# Patient Record
Sex: Male | Born: 1935 | Race: White | Hispanic: No
Health system: Southern US, Community
[De-identification: ages and names within clinical notes are randomized; demographics above are authoritative.]

---

## 2009-10-21 ENCOUNTER — Emergency Department: Payer: Self-pay

## 2009-11-11 ENCOUNTER — Ambulatory Visit: Payer: Self-pay | Admitting: Urology

## 2009-11-29 ENCOUNTER — Ambulatory Visit: Payer: Self-pay | Admitting: Urology

## 2010-01-12 ENCOUNTER — Emergency Department: Payer: Self-pay | Admitting: Internal Medicine

## 2010-02-04 ENCOUNTER — Emergency Department: Payer: Self-pay | Admitting: Emergency Medicine

## 2010-02-22 ENCOUNTER — Ambulatory Visit: Payer: Self-pay | Admitting: Urology

## 2011-10-19 ENCOUNTER — Emergency Department: Payer: Self-pay | Admitting: Emergency Medicine

## 2011-12-17 ENCOUNTER — Ambulatory Visit: Payer: Self-pay | Admitting: Urology

## 2011-12-26 ENCOUNTER — Ambulatory Visit: Payer: Self-pay | Admitting: Urology

## 2012-01-10 ENCOUNTER — Ambulatory Visit: Payer: Self-pay | Admitting: Oncology

## 2012-01-15 ENCOUNTER — Ambulatory Visit: Payer: Self-pay | Admitting: Oncology

## 2012-02-01 ENCOUNTER — Ambulatory Visit: Payer: Self-pay | Admitting: Oncology

## 2012-03-03 ENCOUNTER — Ambulatory Visit: Payer: Self-pay | Admitting: Oncology

## 2012-03-07 LAB — CBC CANCER CENTER
Eosinophil #: 0.2 x10 3/mm (ref 0.0–0.7)
HGB: 11.7 g/dL — ABNORMAL LOW (ref 13.0–18.0)
Lymphocyte #: 2 x10 3/mm (ref 1.0–3.6)
MCH: 30.4 pg (ref 26.0–34.0)
MCHC: 33.6 g/dL (ref 32.0–36.0)
MCV: 91 fL (ref 80–100)
Monocyte #: 0.4 x10 3/mm (ref 0.0–0.7)
RDW: 15.7 % — ABNORMAL HIGH (ref 11.5–14.5)

## 2012-03-07 LAB — COMPREHENSIVE METABOLIC PANEL
Albumin: 3.8 g/dL (ref 3.4–5.0)
Anion Gap: 10 (ref 7–16)
BUN: 22 mg/dL — ABNORMAL HIGH (ref 7–18)
Co2: 28 mmol/L (ref 21–32)
Creatinine: 1.03 mg/dL (ref 0.60–1.30)
EGFR (African American): >60
EGFR (Non-African Amer.): >60
Glucose: 129 mg/dL — ABNORMAL HIGH (ref 65–99)
Osmolality: 283 (ref 275–301)
SGPT (ALT): 15 U/L
Sodium: 139 mmol/L (ref 136–145)

## 2012-03-21 LAB — CBC CANCER CENTER
Basophil #: 0 x10 3/mm (ref 0.0–0.1)
Basophil %: 0.4 %
HCT: 35.7 % — ABNORMAL LOW (ref 40.0–52.0)
HGB: 11.5 g/dL — ABNORMAL LOW (ref 13.0–18.0)
Lymphocyte #: 2 x10 3/mm (ref 1.0–3.6)
Lymphocyte %: 21.5 %
Monocyte #: 0.5 x10 3/mm (ref 0.2–1.0)
Neutrophil #: 6.5 x10 3/mm (ref 1.4–6.5)
Platelet: 244 x10 3/mm (ref 150–440)
RBC: 3.9 10*6/uL — ABNORMAL LOW (ref 4.40–5.90)

## 2012-03-21 LAB — COMPREHENSIVE METABOLIC PANEL
Albumin: 3.9 g/dL (ref 3.4–5.0)
Anion Gap: 11 (ref 7–16)
BUN: 30 mg/dL — ABNORMAL HIGH (ref 7–18)
Bilirubin,Total: 0.2 mg/dL (ref 0.2–1.0)
Calcium, Total: 9.5 mg/dL (ref 8.5–10.1)
Chloride: 100 mmol/L (ref 98–107)
Co2: 29 mmol/L (ref 21–32)
Creatinine: 0.86 mg/dL (ref 0.60–1.30)
Glucose: 117 mg/dL — ABNORMAL HIGH (ref 65–99)
Osmolality: 287 (ref 275–301)
Potassium: 3 mmol/L — ABNORMAL LOW (ref 3.5–5.1)
SGOT(AST): 12 U/L — ABNORMAL LOW (ref 15–37)
SGPT (ALT): 14 U/L
Sodium: 140 mmol/L (ref 136–145)

## 2012-03-23 LAB — PSA: PSA: 135.9 ng/mL — ABNORMAL HIGH (ref 0.0–4.0)

## 2012-04-02 ENCOUNTER — Ambulatory Visit: Payer: Self-pay | Admitting: Oncology

## 2012-04-04 LAB — CBC CANCER CENTER
Eosinophil #: 0.1 x10 3/mm (ref 0.0–0.7)
HCT: 37.2 % — ABNORMAL LOW (ref 40.0–52.0)
HGB: 12 g/dL — ABNORMAL LOW (ref 13.0–18.0)
Lymphocyte %: 18.7 %
MCH: 29.5 pg (ref 26.0–34.0)
MCHC: 32.1 g/dL (ref 32.0–36.0)
Monocyte %: 5.2 %
RBC: 4.06 10*6/uL — ABNORMAL LOW (ref 4.40–5.90)
RDW: 15.8 % — ABNORMAL HIGH (ref 11.5–14.5)
WBC: 11.1 x10 3/mm — ABNORMAL HIGH (ref 3.8–10.6)

## 2012-04-04 LAB — COMPREHENSIVE METABOLIC PANEL
Albumin: 3.9 g/dL (ref 3.4–5.0)
Alkaline Phosphatase: 97 U/L (ref 50–136)
Calcium, Total: 8.9 mg/dL (ref 8.5–10.1)
Co2: 30 mmol/L (ref 21–32)
Creatinine: 1.05 mg/dL (ref 0.60–1.30)
EGFR (African American): >60
EGFR (Non-African Amer.): >60
Potassium: 2.9 mmol/L — ABNORMAL LOW (ref 3.5–5.1)
SGPT (ALT): 16 U/L
Sodium: 140 mmol/L (ref 136–145)
Total Protein: 7.2 g/dL (ref 6.4–8.2)

## 2012-04-05 LAB — PSA: PSA: 95.3 ng/mL — ABNORMAL HIGH (ref 0.0–4.0)

## 2012-05-03 ENCOUNTER — Ambulatory Visit: Payer: Self-pay | Admitting: Oncology

## 2012-05-09 LAB — COMPREHENSIVE METABOLIC PANEL
Albumin: 4 g/dL (ref 3.4–5.0)
Alkaline Phosphatase: 101 U/L (ref 50–136)
Anion Gap: 10 (ref 7–16)
BUN: 32 mg/dL — ABNORMAL HIGH (ref 7–18)
Bilirubin,Total: 0.3 mg/dL (ref 0.2–1.0)
Calcium, Total: 9.2 mg/dL (ref 8.5–10.1)
Chloride: 102 mmol/L (ref 98–107)
Co2: 29 mmol/L (ref 21–32)
Creatinine: 0.95 mg/dL (ref 0.60–1.30)
EGFR (African American): >60
Potassium: 3.3 mmol/L — ABNORMAL LOW (ref 3.5–5.1)
Total Protein: 7.3 g/dL (ref 6.4–8.2)

## 2012-06-02 ENCOUNTER — Ambulatory Visit: Payer: Self-pay | Admitting: Oncology

## 2012-06-06 LAB — CBC CANCER CENTER
Basophil #: 0.1 x10 3/mm (ref 0.0–0.1)
Basophil %: 0.7 %
Eosinophil %: 0.9 %
HCT: 40.1 % (ref 40.0–52.0)
HGB: 13.2 g/dL (ref 13.0–18.0)
Lymphocyte #: 1.7 x10 3/mm (ref 1.0–3.6)
MCH: 30.6 pg (ref 26.0–34.0)
Monocyte #: 0.5 x10 3/mm (ref 0.2–1.0)
Platelet: 205 x10 3/mm (ref 150–440)
RDW: 17.3 % — ABNORMAL HIGH (ref 11.5–14.5)
WBC: 10.9 x10 3/mm — ABNORMAL HIGH (ref 3.8–10.6)

## 2012-06-06 LAB — COMPREHENSIVE METABOLIC PANEL
Albumin: 3.9 g/dL (ref 3.4–5.0)
Alkaline Phosphatase: 101 U/L (ref 50–136)
Bilirubin,Total: 0.3 mg/dL (ref 0.2–1.0)
Calcium, Total: 9.2 mg/dL (ref 8.5–10.1)
Co2: 29 mmol/L (ref 21–32)
Creatinine: 1.12 mg/dL (ref 0.60–1.30)
EGFR (Non-African Amer.): >60
Glucose: 118 mg/dL — ABNORMAL HIGH (ref 65–99)
Osmolality: 283 (ref 275–301)
Potassium: 2.9 mmol/L — ABNORMAL LOW (ref 3.5–5.1)
Sodium: 137 mmol/L (ref 136–145)

## 2012-07-03 ENCOUNTER — Ambulatory Visit: Payer: Self-pay | Admitting: Oncology

## 2012-08-14 ENCOUNTER — Ambulatory Visit: Payer: Self-pay | Admitting: Vascular Surgery

## 2012-08-14 LAB — CBC
HGB: 12.8 g/dL — ABNORMAL LOW (ref 13.0–18.0)
MCH: 32.4 pg (ref 26.0–34.0)
MCV: 99 fL (ref 80–100)
Platelet: 231 10*3/uL (ref 150–440)
RBC: 3.94 10*6/uL — ABNORMAL LOW (ref 4.40–5.90)
RDW: 15 % — ABNORMAL HIGH (ref 11.5–14.5)

## 2012-08-14 LAB — BASIC METABOLIC PANEL
Anion Gap: 7 (ref 7–16)
Calcium, Total: 9.1 mg/dL (ref 8.5–10.1)
Chloride: 109 mmol/L — ABNORMAL HIGH (ref 98–107)
Co2: 25 mmol/L (ref 21–32)
Creatinine: 0.76 mg/dL (ref 0.60–1.30)
EGFR (African American): >60
Glucose: 93 mg/dL (ref 65–99)
Osmolality: 287 (ref 275–301)
Sodium: 141 mmol/L (ref 136–145)

## 2012-08-22 ENCOUNTER — Inpatient Hospital Stay: Payer: Self-pay | Admitting: Vascular Surgery

## 2012-08-23 LAB — COMPREHENSIVE METABOLIC PANEL
Alkaline Phosphatase: 76 U/L (ref 50–136)
BUN: 14 mg/dL (ref 7–18)
Bilirubin,Total: 0.4 mg/dL (ref 0.2–1.0)
Co2: 23 mmol/L (ref 21–32)
EGFR (Non-African Amer.): >60
Osmolality: 284 (ref 275–301)
Potassium: 3.9 mmol/L (ref 3.5–5.1)
SGPT (ALT): 11 U/L — ABNORMAL LOW (ref 12–78)
Sodium: 142 mmol/L (ref 136–145)
Total Protein: 5.8 g/dL — ABNORMAL LOW (ref 6.4–8.2)

## 2012-08-23 LAB — CBC WITH DIFFERENTIAL/PLATELET
Basophil #: 0 10*3/uL (ref 0.0–0.1)
Eosinophil #: 0 10*3/uL (ref 0.0–0.7)
Eosinophil %: 0 %
HCT: 34.4 % — ABNORMAL LOW (ref 40.0–52.0)
Lymphocyte #: 1.2 10*3/uL (ref 1.0–3.6)
MCH: 32.8 pg (ref 26.0–34.0)
MCV: 97 fL (ref 80–100)
Monocyte #: 0.7 x10 3/mm (ref 0.2–1.0)
Monocyte %: 4.7 %
Neutrophil #: 13.1 10*3/uL — ABNORMAL HIGH (ref 1.4–6.5)
RDW: 14.6 % — ABNORMAL HIGH (ref 11.5–14.5)
WBC: 15 10*3/uL — ABNORMAL HIGH (ref 3.8–10.6)

## 2012-08-23 LAB — PROTIME-INR: INR: 1

## 2012-08-23 LAB — PHOSPHORUS: Phosphorus: 3.7 mg/dL (ref 2.5–4.9)

## 2012-09-09 ENCOUNTER — Ambulatory Visit: Payer: Self-pay | Admitting: Oncology

## 2012-09-11 ENCOUNTER — Ambulatory Visit: Payer: Self-pay | Admitting: Oncology

## 2012-09-11 LAB — CBC CANCER CENTER
Basophil %: 0.8 %
Eosinophil #: 0.2 x10 3/mm (ref 0.0–0.7)
Eosinophil %: 2 %
HCT: 38.2 % — ABNORMAL LOW (ref 40.0–52.0)
HGB: 12.3 g/dL — ABNORMAL LOW (ref 13.0–18.0)
Lymphocyte #: 2.7 x10 3/mm (ref 1.0–3.6)
MCH: 32.2 pg (ref 26.0–34.0)
MCHC: 32.3 g/dL (ref 32.0–36.0)
MCV: 100 fL (ref 80–100)
Monocyte #: 0.5 x10 3/mm (ref 0.2–1.0)
Platelet: 258 x10 3/mm (ref 150–440)
RBC: 3.83 10*6/uL — ABNORMAL LOW (ref 4.40–5.90)

## 2012-09-11 LAB — COMPREHENSIVE METABOLIC PANEL
Anion Gap: 11 (ref 7–16)
BUN: 27 mg/dL — ABNORMAL HIGH (ref 7–18)
Bilirubin,Total: 0.3 mg/dL (ref 0.2–1.0)
Calcium, Total: 9 mg/dL (ref 8.5–10.1)
Chloride: 105 mmol/L (ref 98–107)
Co2: 25 mmol/L (ref 21–32)
EGFR (African American): >60
EGFR (Non-African Amer.): >60
Glucose: 97 mg/dL (ref 65–99)
Osmolality: 286 (ref 275–301)
Potassium: 4.1 mmol/L (ref 3.5–5.1)
SGOT(AST): 15 U/L (ref 15–37)
SGPT (ALT): 19 U/L (ref 12–78)
Sodium: 141 mmol/L (ref 136–145)

## 2012-09-12 LAB — PSA: PSA: 38.2 ng/mL — ABNORMAL HIGH (ref 0.0–4.0)

## 2012-10-03 ENCOUNTER — Ambulatory Visit: Payer: Self-pay | Admitting: Oncology

## 2012-11-11 ENCOUNTER — Ambulatory Visit: Payer: Self-pay | Admitting: Emergency Medicine

## 2012-12-12 ENCOUNTER — Ambulatory Visit: Payer: Self-pay | Admitting: Oncology

## 2012-12-12 LAB — BASIC METABOLIC PANEL
BUN: 24 mg/dL — ABNORMAL HIGH (ref 7–18)
Chloride: 107 mmol/L (ref 98–107)
Co2: 26 mmol/L (ref 21–32)
Creatinine: 0.96 mg/dL (ref 0.60–1.30)
EGFR (African American): >60
EGFR (Non-African Amer.): >60
Glucose: 101 mg/dL — ABNORMAL HIGH (ref 65–99)
Osmolality: 291 (ref 275–301)
Potassium: 3.5 mmol/L (ref 3.5–5.1)
Sodium: 144 mmol/L (ref 136–145)

## 2012-12-15 LAB — PSA: PSA: 21.9 ng/mL — ABNORMAL HIGH (ref 0.0–4.0)

## 2013-01-03 ENCOUNTER — Ambulatory Visit: Payer: Self-pay | Admitting: Oncology

## 2013-03-24 ENCOUNTER — Ambulatory Visit: Payer: Self-pay | Admitting: Oncology

## 2013-03-24 LAB — CBC CANCER CENTER
Basophil #: 0.1 x10 3/mm (ref 0.0–0.1)
Eosinophil #: 0.2 x10 3/mm (ref 0.0–0.7)
Eosinophil %: 1.6 %
HCT: 39.1 % — ABNORMAL LOW (ref 40.0–52.0)
Lymphocyte #: 3.4 x10 3/mm (ref 1.0–3.6)
Lymphocyte %: 28.3 %
MCHC: 33.6 g/dL (ref 32.0–36.0)
Monocyte %: 5.5 %
Neutrophil #: 7.7 x10 3/mm — ABNORMAL HIGH (ref 1.4–6.5)
Platelet: 186 x10 3/mm (ref 150–440)
RBC: 4.07 10*6/uL — ABNORMAL LOW (ref 4.40–5.90)
RDW: 15.4 % — ABNORMAL HIGH (ref 11.5–14.5)

## 2013-03-24 LAB — COMPREHENSIVE METABOLIC PANEL
Anion Gap: 13 (ref 7–16)
Creatinine: 0.9 mg/dL (ref 0.60–1.30)
EGFR (African American): >60
Osmolality: 288 (ref 275–301)
SGOT(AST): 9 U/L — ABNORMAL LOW (ref 15–37)
SGPT (ALT): 14 U/L (ref 12–78)
Sodium: 142 mmol/L (ref 136–145)
Total Protein: 7 g/dL (ref 6.4–8.2)

## 2013-04-02 ENCOUNTER — Ambulatory Visit: Payer: Self-pay | Admitting: Oncology

## 2013-07-07 ENCOUNTER — Ambulatory Visit: Payer: Self-pay | Admitting: Ophthalmology

## 2013-07-13 ENCOUNTER — Ambulatory Visit: Payer: Self-pay | Admitting: Ophthalmology

## 2013-07-23 ENCOUNTER — Ambulatory Visit: Payer: Self-pay | Admitting: Oncology

## 2013-07-31 LAB — COMPREHENSIVE METABOLIC PANEL
Albumin: 3.7 g/dL (ref 3.4–5.0)
Alkaline Phosphatase: 86 U/L (ref 50–136)
Anion Gap: 11 (ref 7–16)
BUN: 35 mg/dL — ABNORMAL HIGH (ref 7–18)
Calcium, Total: 9.1 mg/dL (ref 8.5–10.1)
Co2: 27 mmol/L (ref 21–32)
Creatinine: 1.03 mg/dL (ref 0.60–1.30)
Osmolality: 290 (ref 275–301)
Potassium: 3.3 mmol/L — ABNORMAL LOW (ref 3.5–5.1)
Sodium: 140 mmol/L (ref 136–145)
Total Protein: 6.8 g/dL (ref 6.4–8.2)

## 2013-07-31 LAB — CBC CANCER CENTER
Basophil #: 0.1 x10 3/mm (ref 0.0–0.1)
Eosinophil %: 0.1 %
Lymphocyte %: 13.7 %
MCV: 98 fL (ref 80–100)
Monocyte #: 0.4 x10 3/mm (ref 0.2–1.0)
Neutrophil #: 9.1 x10 3/mm — ABNORMAL HIGH (ref 1.4–6.5)
RDW: 14 % (ref 11.5–14.5)
WBC: 11.1 x10 3/mm — ABNORMAL HIGH (ref 3.8–10.6)

## 2013-07-31 LAB — MAGNESIUM: Magnesium: 2.1 mg/dL

## 2013-08-01 LAB — PSA: PSA: 73.6 ng/mL — ABNORMAL HIGH (ref 0.0–4.0)

## 2013-08-03 ENCOUNTER — Ambulatory Visit: Payer: Self-pay | Admitting: Oncology

## 2013-09-08 ENCOUNTER — Ambulatory Visit: Payer: Self-pay

## 2013-10-26 IMAGING — XA IR VASCULAR PROCEDURE
8 series · 15 of 24 positions shown · IV contrast (IODINE)
Comparison: none

[Series 1: care aorta · 5 acquisitions, 2 frames shown (1 of 8)]
[im 1/5]
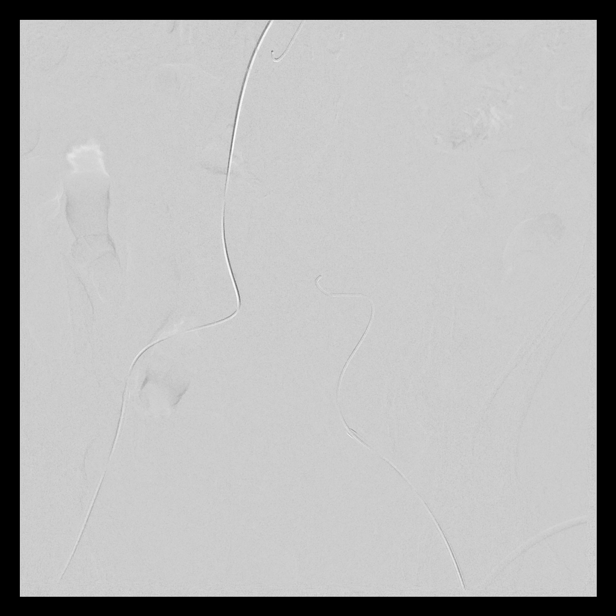
[im 3/5]
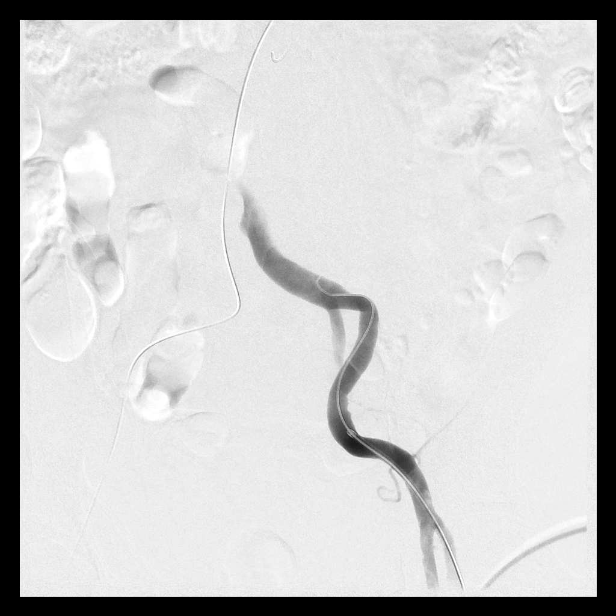

[Series 2: care aorta · 4 acquisitions, 2 frames shown (2 of 8)]
[im 1/4]
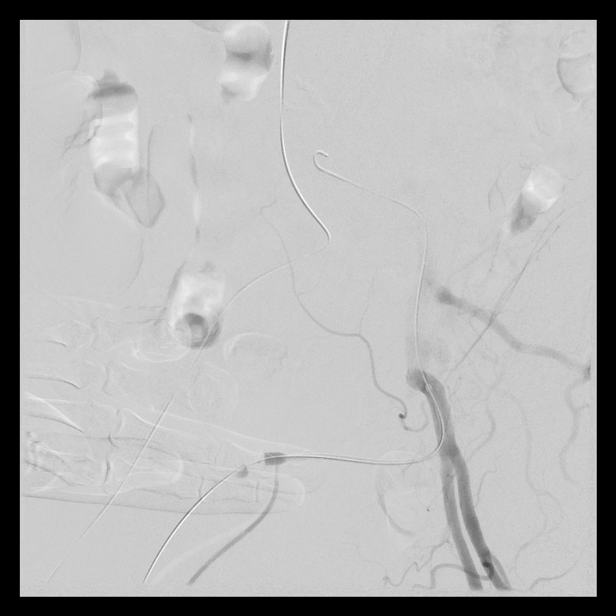
[im 4/4]
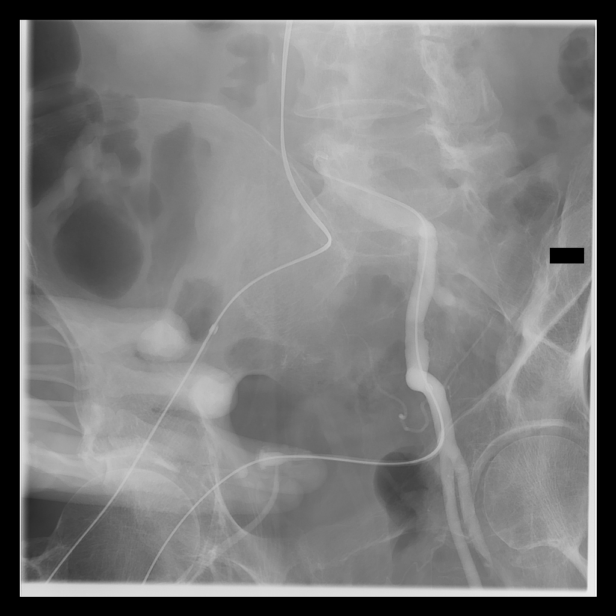

[Series 3: care aorta · 4 acquisitions, 2 frames shown (3 of 8)]
[im 1/4]
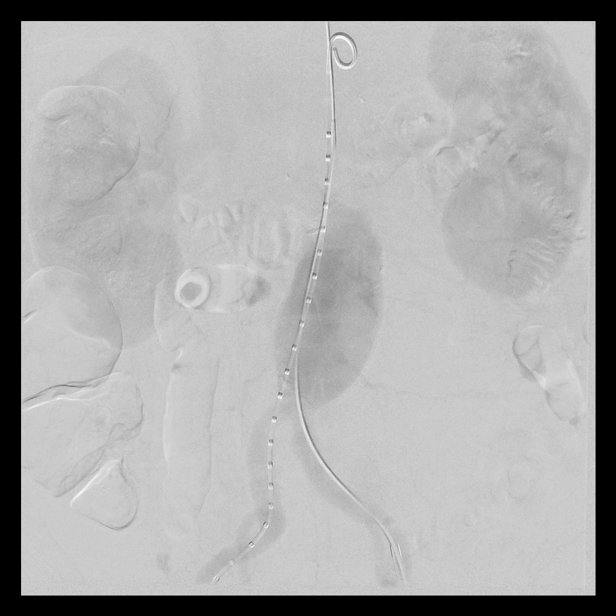
[im 3/4]
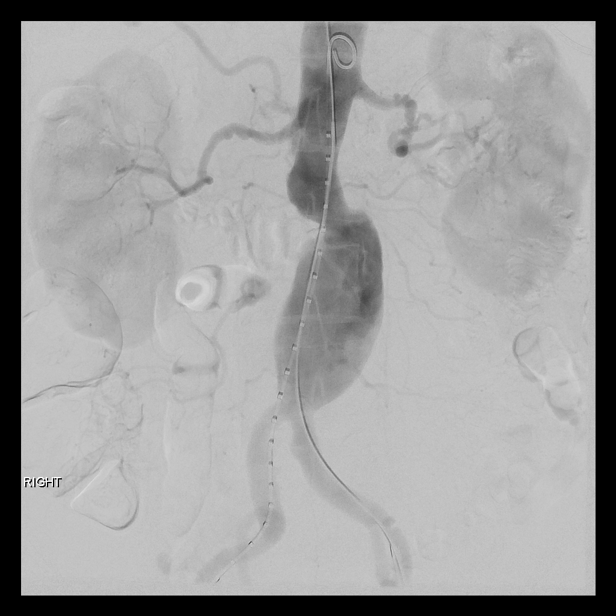

[Series 4: care aorta · 1 of 36 frames shown (4 of 8)]
[frame 19/36]
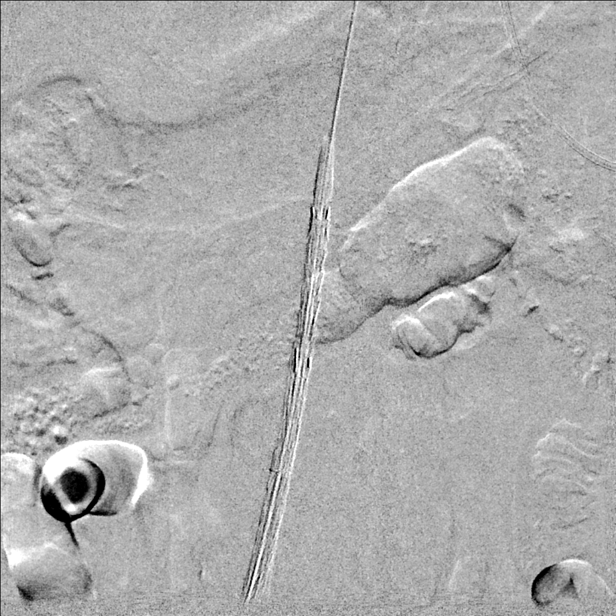

[Series 5: care aorta · 3 acquisitions, 2 frames shown (5 of 8)]
[im 1/3]
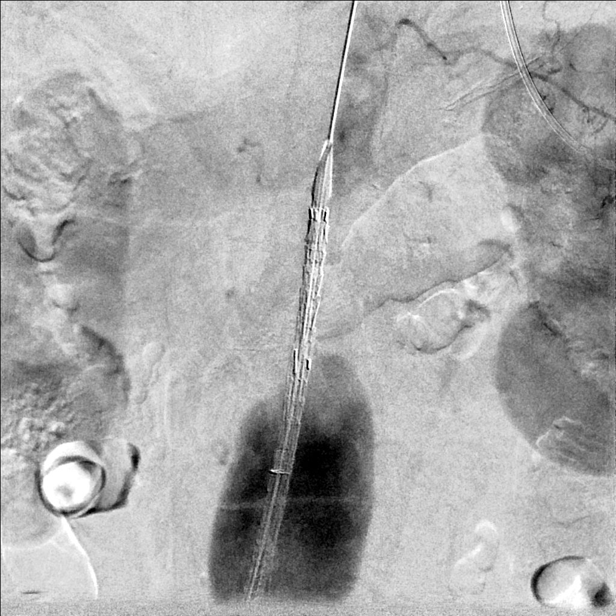
[im 2/3]
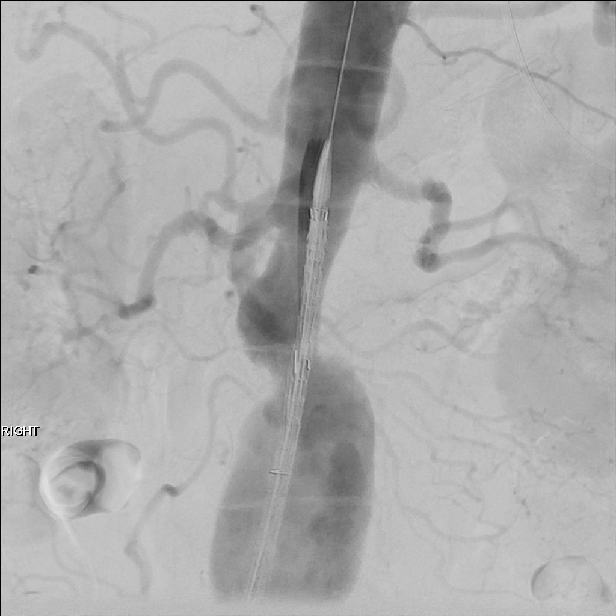

[Series 6: care aorta · 4 acquisitions, 2 frames shown (6 of 8)]
[im 1/4]
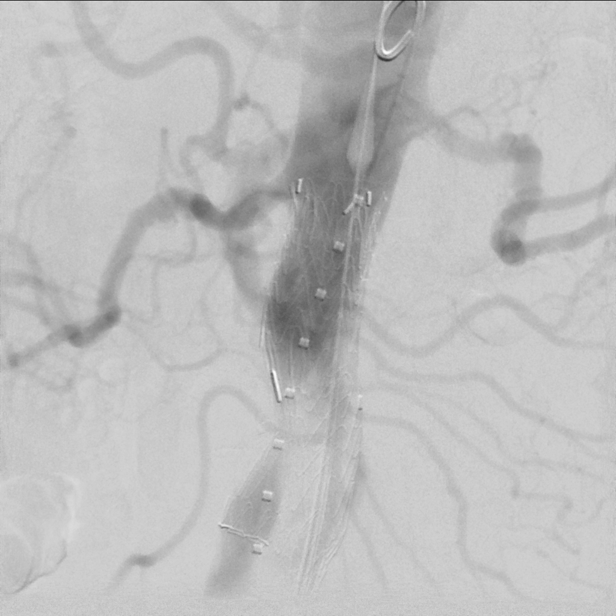
[im 2/4]
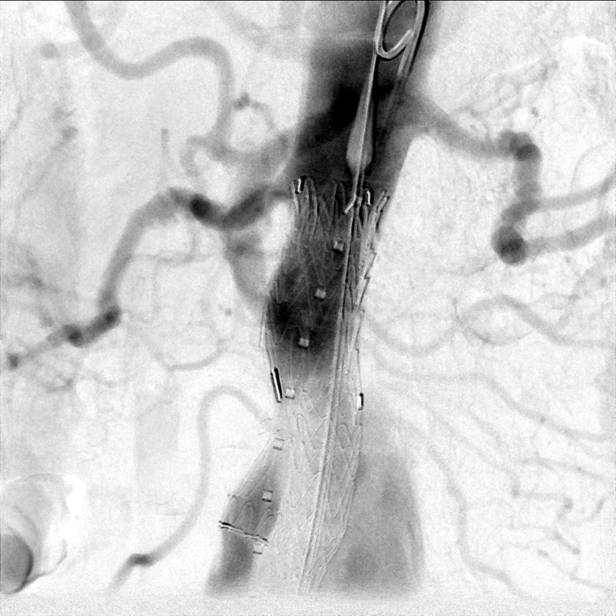

[Series 7: care aorta · 4 acquisitions, 2 frames shown (7 of 8)]
[im 1/4]
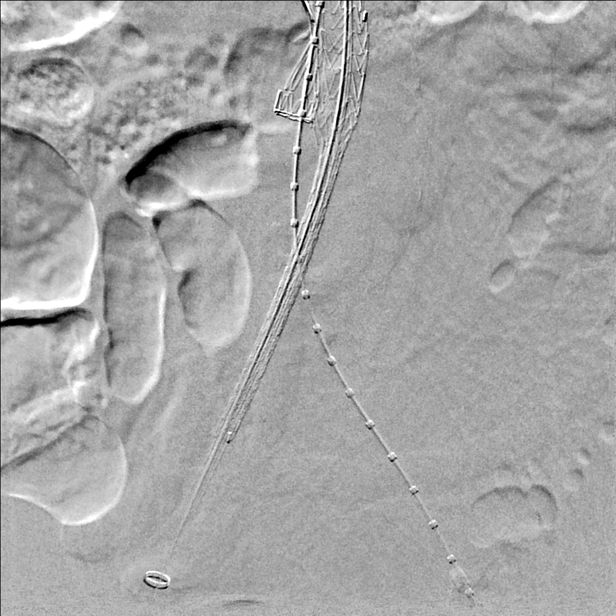
[im 3/4]
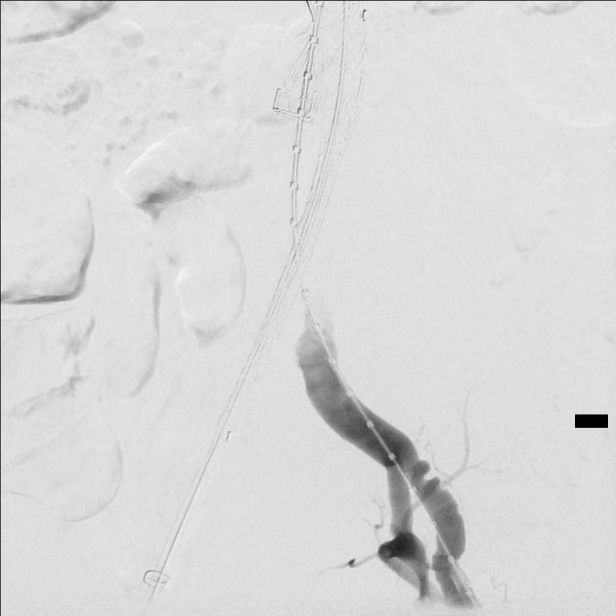

[Series 8: care aorta · 2 acquisitions, 2 frames shown (8 of 8)]
[im 1/2]
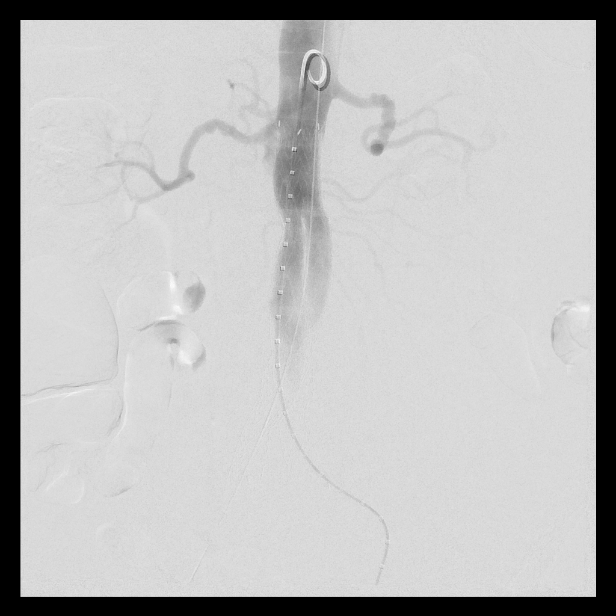
[im 2/2]
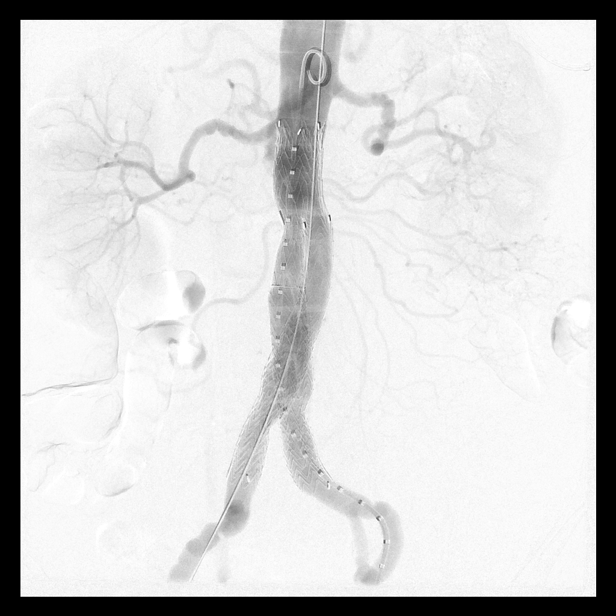

[15 of 24 positions shown; findings below may reference images not displayed]

IMAGES IMPORTED FROM THE SYNGO WORKFLOW SYSTEM
NO DICTATION FOR STUDY

## 2013-11-09 ENCOUNTER — Ambulatory Visit: Payer: Self-pay | Admitting: Ophthalmology

## 2013-12-11 ENCOUNTER — Ambulatory Visit: Payer: Self-pay | Admitting: Oncology

## 2013-12-11 LAB — CBC CANCER CENTER
BASOS PCT: 0.9 %
Basophil #: 0.1 x10 3/mm (ref 0.0–0.1)
Eosinophil #: 0 x10 3/mm (ref 0.0–0.7)
Eosinophil %: 0.1 %
HCT: 40.3 % (ref 40.0–52.0)
HGB: 13.3 g/dL (ref 13.0–18.0)
LYMPHS ABS: 1.7 x10 3/mm (ref 1.0–3.6)
LYMPHS PCT: 14.2 %
MCH: 32.8 pg (ref 26.0–34.0)
MCHC: 33.1 g/dL (ref 32.0–36.0)
MCV: 99 fL (ref 80–100)
Monocyte #: 0.4 x10 3/mm (ref 0.2–1.0)
Monocyte %: 3.7 %
NEUTROS ABS: 9.6 x10 3/mm — AB (ref 1.4–6.5)
NEUTROS PCT: 81.1 %
Platelet: 210 x10 3/mm (ref 150–440)
RBC: 4.07 10*6/uL — ABNORMAL LOW (ref 4.40–5.90)
RDW: 14.6 % — AB (ref 11.5–14.5)
WBC: 11.9 x10 3/mm — AB (ref 3.8–10.6)

## 2013-12-11 LAB — MAGNESIUM: MAGNESIUM: 1.9 mg/dL

## 2013-12-11 LAB — COMPREHENSIVE METABOLIC PANEL
ALK PHOS: 101 U/L
ANION GAP: 10 (ref 7–16)
AST: 12 U/L — AB (ref 15–37)
Albumin: 3.8 g/dL (ref 3.4–5.0)
BUN: 17 mg/dL (ref 7–18)
Bilirubin,Total: 0.3 mg/dL (ref 0.2–1.0)
Calcium, Total: 9.2 mg/dL (ref 8.5–10.1)
Chloride: 103 mmol/L (ref 98–107)
Co2: 25 mmol/L (ref 21–32)
Creatinine: 0.88 mg/dL (ref 0.60–1.30)
EGFR (African American): >60
EGFR (Non-African Amer.): >60
Glucose: 131 mg/dL — ABNORMAL HIGH (ref 65–99)
Osmolality: 279 (ref 275–301)
Potassium: 3.3 mmol/L — ABNORMAL LOW (ref 3.5–5.1)
SGPT (ALT): 14 U/L (ref 12–78)
SODIUM: 138 mmol/L (ref 136–145)
TOTAL PROTEIN: 6.7 g/dL (ref 6.4–8.2)

## 2013-12-12 LAB — PSA: PSA: 214.1 ng/mL — ABNORMAL HIGH (ref 0.0–4.0)

## 2013-12-25 LAB — PSA: PSA: 212.9 ng/mL — ABNORMAL HIGH (ref 0.0–4.0)

## 2013-12-29 LAB — CREATININE, SERUM
CREATININE: 0.91 mg/dL (ref 0.60–1.30)
EGFR (African American): >60

## 2014-01-03 ENCOUNTER — Ambulatory Visit: Payer: Self-pay | Admitting: Oncology

## 2014-01-14 LAB — CBC CANCER CENTER
BASOS ABS: 0.1 x10 3/mm (ref 0.0–0.1)
Basophil %: 0.8 %
Eosinophil #: 0.2 x10 3/mm (ref 0.0–0.7)
Eosinophil %: 2.7 %
HCT: 35.5 % — ABNORMAL LOW (ref 40.0–52.0)
HGB: 11.7 g/dL — ABNORMAL LOW (ref 13.0–18.0)
Lymphocyte #: 2.3 x10 3/mm (ref 1.0–3.6)
Lymphocyte %: 26.5 %
MCH: 32.6 pg (ref 26.0–34.0)
MCHC: 32.9 g/dL (ref 32.0–36.0)
MCV: 99 fL (ref 80–100)
Monocyte #: 0.9 x10 3/mm (ref 0.2–1.0)
Monocyte %: 10.8 %
NEUTROS ABS: 5.1 x10 3/mm (ref 1.4–6.5)
Neutrophil %: 59.2 %
PLATELETS: 253 x10 3/mm (ref 150–440)
RBC: 3.58 10*6/uL — AB (ref 4.40–5.90)
RDW: 14.3 % (ref 11.5–14.5)
WBC: 8.7 x10 3/mm (ref 3.8–10.6)

## 2014-01-14 LAB — COMPREHENSIVE METABOLIC PANEL
ANION GAP: 13 (ref 7–16)
AST: 13 U/L — AB (ref 15–37)
Albumin: 3.5 g/dL (ref 3.4–5.0)
Alkaline Phosphatase: 96 U/L
BILIRUBIN TOTAL: 0.2 mg/dL (ref 0.2–1.0)
BUN: 26 mg/dL — ABNORMAL HIGH (ref 7–18)
CREATININE: 1.32 mg/dL — AB (ref 0.60–1.30)
Calcium, Total: 8.4 mg/dL — ABNORMAL LOW (ref 8.5–10.1)
Chloride: 103 mmol/L (ref 98–107)
Co2: 23 mmol/L (ref 21–32)
EGFR (Non-African Amer.): 52 — ABNORMAL LOW
Glucose: 104 mg/dL — ABNORMAL HIGH (ref 65–99)
Osmolality: 283 (ref 275–301)
Potassium: 3.6 mmol/L (ref 3.5–5.1)
SGPT (ALT): 12 U/L (ref 12–78)
SODIUM: 139 mmol/L (ref 136–145)
TOTAL PROTEIN: 7 g/dL (ref 6.4–8.2)

## 2014-01-15 LAB — PSA: PSA: 1015 ng/mL — AB (ref 0.0–4.0)

## 2014-01-21 LAB — CBC CANCER CENTER
Basophil #: 0.1 x10 3/mm (ref 0.0–0.1)
Basophil %: 0.8 %
Eosinophil #: 0.2 x10 3/mm (ref 0.0–0.7)
Eosinophil %: 1.8 %
HCT: 36.3 % — ABNORMAL LOW (ref 40.0–52.0)
HGB: 11.8 g/dL — ABNORMAL LOW (ref 13.0–18.0)
LYMPHS ABS: 2.2 x10 3/mm (ref 1.0–3.6)
Lymphocyte %: 17.8 %
MCH: 32 pg (ref 26.0–34.0)
MCHC: 32.5 g/dL (ref 32.0–36.0)
MCV: 98 fL (ref 80–100)
MONO ABS: 1.3 x10 3/mm — AB (ref 0.2–1.0)
Monocyte %: 10.7 %
NEUTROS ABS: 8.7 x10 3/mm — AB (ref 1.4–6.5)
NEUTROS PCT: 68.9 %
Platelet: 290 x10 3/mm (ref 150–440)
RBC: 3.69 10*6/uL — ABNORMAL LOW (ref 4.40–5.90)
RDW: 14.3 % (ref 11.5–14.5)
WBC: 12.6 x10 3/mm — ABNORMAL HIGH (ref 3.8–10.6)

## 2014-01-21 LAB — COMPREHENSIVE METABOLIC PANEL
ALBUMIN: 3.5 g/dL (ref 3.4–5.0)
Alkaline Phosphatase: 96 U/L
Anion Gap: 11 (ref 7–16)
BUN: 23 mg/dL — AB (ref 7–18)
Bilirubin,Total: 0.2 mg/dL (ref 0.2–1.0)
CO2: 24 mmol/L (ref 21–32)
CREATININE: 0.98 mg/dL (ref 0.60–1.30)
Calcium, Total: 8.1 mg/dL — ABNORMAL LOW (ref 8.5–10.1)
Chloride: 103 mmol/L (ref 98–107)
EGFR (African American): >60
GLUCOSE: 104 mg/dL — AB (ref 65–99)
OSMOLALITY: 280 (ref 275–301)
Potassium: 3.8 mmol/L (ref 3.5–5.1)
SGOT(AST): 13 U/L — ABNORMAL LOW (ref 15–37)
SGPT (ALT): 15 U/L (ref 12–78)
Sodium: 138 mmol/L (ref 136–145)
TOTAL PROTEIN: 7.1 g/dL (ref 6.4–8.2)

## 2014-01-31 ENCOUNTER — Ambulatory Visit: Payer: Self-pay | Admitting: Oncology

## 2014-02-01 ENCOUNTER — Ambulatory Visit: Payer: Self-pay | Admitting: Vascular Surgery

## 2014-02-04 LAB — CBC CANCER CENTER
Basophil #: 0.1 x10 3/mm (ref 0.0–0.1)
Basophil %: 0.8 %
Eosinophil #: 0.1 x10 3/mm (ref 0.0–0.7)
Eosinophil %: 0.5 %
HCT: 35.5 % — ABNORMAL LOW (ref 40.0–52.0)
HGB: 11.4 g/dL — ABNORMAL LOW (ref 13.0–18.0)
Lymphocyte #: 2.6 x10 3/mm (ref 1.0–3.6)
Lymphocyte %: 14.2 %
MCH: 31.6 pg (ref 26.0–34.0)
MCHC: 32.2 g/dL (ref 32.0–36.0)
MCV: 98 fL (ref 80–100)
MONOS PCT: 7 %
Monocyte #: 1.3 x10 3/mm — ABNORMAL HIGH (ref 0.2–1.0)
Neutrophil #: 14.2 x10 3/mm — ABNORMAL HIGH (ref 1.4–6.5)
Neutrophil %: 77.5 %
PLATELETS: 237 x10 3/mm (ref 150–440)
RBC: 3.62 10*6/uL — ABNORMAL LOW (ref 4.40–5.90)
RDW: 14.5 % (ref 11.5–14.5)
WBC: 18.4 x10 3/mm — AB (ref 3.8–10.6)

## 2014-02-04 LAB — COMPREHENSIVE METABOLIC PANEL
ALBUMIN: 3.5 g/dL (ref 3.4–5.0)
ALK PHOS: 88 U/L
Anion Gap: 11 (ref 7–16)
BUN: 24 mg/dL — AB (ref 7–18)
Bilirubin,Total: 0.3 mg/dL (ref 0.2–1.0)
CO2: 22 mmol/L (ref 21–32)
Calcium, Total: 7.7 mg/dL — ABNORMAL LOW (ref 8.5–10.1)
Chloride: 103 mmol/L (ref 98–107)
Creatinine: 0.9 mg/dL (ref 0.60–1.30)
EGFR (African American): >60
EGFR (Non-African Amer.): >60
GLUCOSE: 118 mg/dL — AB (ref 65–99)
Osmolality: 277 (ref 275–301)
Potassium: 3.4 mmol/L — ABNORMAL LOW (ref 3.5–5.1)
SGOT(AST): 17 U/L (ref 15–37)
SGPT (ALT): 28 U/L (ref 12–78)
Sodium: 136 mmol/L (ref 136–145)
TOTAL PROTEIN: 6.8 g/dL (ref 6.4–8.2)

## 2014-02-05 LAB — PSA: PSA: 1327 ng/mL — ABNORMAL HIGH (ref 0.0–4.0)

## 2014-02-11 LAB — CBC CANCER CENTER
BASOS ABS: 0 x10 3/mm (ref 0.0–0.1)
Basophil %: 0.4 %
EOS ABS: 0 x10 3/mm (ref 0.0–0.7)
Eosinophil %: 0.3 %
HCT: 32.9 % — ABNORMAL LOW (ref 40.0–52.0)
HGB: 10.9 g/dL — AB (ref 13.0–18.0)
LYMPHS ABS: 0.6 x10 3/mm — AB (ref 1.0–3.6)
Lymphocyte %: 28.5 %
MCH: 32.1 pg (ref 26.0–34.0)
MCHC: 33.1 g/dL (ref 32.0–36.0)
MCV: 97 fL (ref 80–100)
MONO ABS: 0 x10 3/mm — AB (ref 0.2–1.0)
MONOS PCT: 1.6 %
Neutrophil #: 1.4 x10 3/mm (ref 1.4–6.5)
Neutrophil %: 69.2 %
Platelet: 207 x10 3/mm (ref 150–440)
RBC: 3.38 10*6/uL — ABNORMAL LOW (ref 4.40–5.90)
RDW: 13.9 % (ref 11.5–14.5)
WBC: 2 x10 3/mm — CL (ref 3.8–10.6)

## 2014-02-25 LAB — COMPREHENSIVE METABOLIC PANEL
ALBUMIN: 3.6 g/dL (ref 3.4–5.0)
ALT: 14 U/L (ref 12–78)
ANION GAP: 12 (ref 7–16)
Alkaline Phosphatase: 84 U/L
BILIRUBIN TOTAL: 0.3 mg/dL (ref 0.2–1.0)
BUN: 17 mg/dL (ref 7–18)
CO2: 22 mmol/L (ref 21–32)
Calcium, Total: 8.3 mg/dL — ABNORMAL LOW (ref 8.5–10.1)
Chloride: 103 mmol/L (ref 98–107)
Creatinine: 1.21 mg/dL (ref 0.60–1.30)
EGFR (African American): >60
EGFR (Non-African Amer.): 58 — ABNORMAL LOW
Glucose: 138 mg/dL — ABNORMAL HIGH (ref 65–99)
Osmolality: 278 (ref 275–301)
Potassium: 3.8 mmol/L (ref 3.5–5.1)
SGOT(AST): 12 U/L — ABNORMAL LOW (ref 15–37)
Sodium: 137 mmol/L (ref 136–145)
Total Protein: 7 g/dL (ref 6.4–8.2)

## 2014-02-25 LAB — CBC CANCER CENTER
BASOS PCT: 0.3 %
Basophil #: 0.1 x10 3/mm (ref 0.0–0.1)
Eosinophil #: 0 x10 3/mm (ref 0.0–0.7)
Eosinophil %: 0 %
HCT: 35.5 % — ABNORMAL LOW (ref 40.0–52.0)
HGB: 11.4 g/dL — AB (ref 13.0–18.0)
Lymphocyte #: 1 x10 3/mm (ref 1.0–3.6)
Lymphocyte %: 4.4 %
MCH: 31 pg (ref 26.0–34.0)
MCHC: 32.3 g/dL (ref 32.0–36.0)
MCV: 96 fL (ref 80–100)
MONO ABS: 0.7 x10 3/mm (ref 0.2–1.0)
Monocyte %: 3 %
Neutrophil #: 20.8 x10 3/mm — ABNORMAL HIGH (ref 1.4–6.5)
Neutrophil %: 92.3 %
PLATELETS: 256 x10 3/mm (ref 150–440)
RBC: 3.7 10*6/uL — ABNORMAL LOW (ref 4.40–5.90)
RDW: 14.2 % (ref 11.5–14.5)
WBC: 22.5 x10 3/mm — ABNORMAL HIGH (ref 3.8–10.6)

## 2014-03-03 ENCOUNTER — Ambulatory Visit: Payer: Self-pay | Admitting: Oncology

## 2014-03-04 LAB — CBC CANCER CENTER
BASOS ABS: 0 x10 3/mm (ref 0.0–0.1)
BASOS PCT: 0.8 %
EOS PCT: 0 %
Eosinophil #: 0 x10 3/mm (ref 0.0–0.7)
HCT: 31.2 % — ABNORMAL LOW (ref 40.0–52.0)
HGB: 10.2 g/dL — AB (ref 13.0–18.0)
LYMPHS PCT: 9.3 %
Lymphocyte #: 0.3 x10 3/mm — ABNORMAL LOW (ref 1.0–3.6)
MCH: 30.7 pg (ref 26.0–34.0)
MCHC: 32.7 g/dL (ref 32.0–36.0)
MCV: 94 fL (ref 80–100)
MONOS PCT: 1.4 %
Monocyte #: 0.1 x10 3/mm — ABNORMAL LOW (ref 0.2–1.0)
Neutrophil #: 3.2 x10 3/mm (ref 1.4–6.5)
Neutrophil %: 88.5 %
PLATELETS: 163 x10 3/mm (ref 150–440)
RBC: 3.32 10*6/uL — ABNORMAL LOW (ref 4.40–5.90)
RDW: 14.5 % (ref 11.5–14.5)
WBC: 3.7 x10 3/mm — ABNORMAL LOW (ref 3.8–10.6)

## 2014-03-04 LAB — BASIC METABOLIC PANEL
Anion Gap: 9 (ref 7–16)
BUN: 24 mg/dL — ABNORMAL HIGH (ref 7–18)
CALCIUM: 8.5 mg/dL (ref 8.5–10.1)
Chloride: 101 mmol/L (ref 98–107)
Co2: 27 mmol/L (ref 21–32)
Creatinine: 0.97 mg/dL (ref 0.60–1.30)
EGFR (African American): >60
EGFR (Non-African Amer.): >60
GLUCOSE: 144 mg/dL — AB (ref 65–99)
Osmolality: 280 (ref 275–301)
Potassium: 3.6 mmol/L (ref 3.5–5.1)
SODIUM: 137 mmol/L (ref 136–145)

## 2014-03-18 LAB — COMPREHENSIVE METABOLIC PANEL
ALBUMIN: 3.3 g/dL — AB (ref 3.4–5.0)
ALK PHOS: 65 U/L
ALT: 15 U/L (ref 12–78)
ANION GAP: 13 (ref 7–16)
AST: 12 U/L — AB (ref 15–37)
BILIRUBIN TOTAL: 0.2 mg/dL (ref 0.2–1.0)
BUN: 21 mg/dL — AB (ref 7–18)
CALCIUM: 8.8 mg/dL (ref 8.5–10.1)
CHLORIDE: 101 mmol/L (ref 98–107)
Co2: 24 mmol/L (ref 21–32)
Creatinine: 1.08 mg/dL (ref 0.60–1.30)
EGFR (African American): >60
Glucose: 127 mg/dL — ABNORMAL HIGH (ref 65–99)
OSMOLALITY: 280 (ref 275–301)
POTASSIUM: 3.7 mmol/L (ref 3.5–5.1)
SODIUM: 138 mmol/L (ref 136–145)
Total Protein: 7 g/dL (ref 6.4–8.2)

## 2014-03-18 LAB — CBC CANCER CENTER
BASOS PCT: 0.5 %
Basophil #: 0.1 x10 3/mm (ref 0.0–0.1)
EOS ABS: 0 x10 3/mm (ref 0.0–0.7)
EOS PCT: 0.2 %
HCT: 32 % — ABNORMAL LOW (ref 40.0–52.0)
HGB: 10.1 g/dL — AB (ref 13.0–18.0)
Lymphocyte #: 1.4 x10 3/mm (ref 1.0–3.6)
Lymphocyte %: 7.1 %
MCH: 29.6 pg (ref 26.0–34.0)
MCHC: 31.6 g/dL — ABNORMAL LOW (ref 32.0–36.0)
MCV: 94 fL (ref 80–100)
MONO ABS: 0.9 x10 3/mm (ref 0.2–1.0)
MONOS PCT: 4.9 %
NEUTROS PCT: 87.3 %
Neutrophil #: 16.6 x10 3/mm — ABNORMAL HIGH (ref 1.4–6.5)
PLATELETS: 255 x10 3/mm (ref 150–440)
RBC: 3.42 10*6/uL — AB (ref 4.40–5.90)
RDW: 15.8 % — ABNORMAL HIGH (ref 11.5–14.5)
WBC: 19.1 x10 3/mm — ABNORMAL HIGH (ref 3.8–10.6)

## 2014-03-19 LAB — PSA: PSA: 1969 ng/mL — AB (ref 0.0–4.0)

## 2014-04-02 ENCOUNTER — Ambulatory Visit: Payer: Self-pay | Admitting: Oncology

## 2014-04-08 LAB — COMPREHENSIVE METABOLIC PANEL
ALK PHOS: 77 U/L
Albumin: 3.4 g/dL (ref 3.4–5.0)
Anion Gap: 11 (ref 7–16)
BUN: 22 mg/dL — ABNORMAL HIGH (ref 7–18)
Bilirubin,Total: 0.3 mg/dL (ref 0.2–1.0)
CALCIUM: 8.4 mg/dL — AB (ref 8.5–10.1)
CO2: 26 mmol/L (ref 21–32)
Chloride: 101 mmol/L (ref 98–107)
Creatinine: 1 mg/dL (ref 0.60–1.30)
EGFR (Non-African Amer.): >60
Glucose: 152 mg/dL — ABNORMAL HIGH (ref 65–99)
OSMOLALITY: 282 (ref 275–301)
Potassium: 3.8 mmol/L (ref 3.5–5.1)
SGOT(AST): 11 U/L — ABNORMAL LOW (ref 15–37)
SGPT (ALT): 23 U/L (ref 12–78)
SODIUM: 138 mmol/L (ref 136–145)
TOTAL PROTEIN: 6.7 g/dL (ref 6.4–8.2)

## 2014-04-08 LAB — CBC CANCER CENTER
Basophil #: 0.1 x10 3/mm (ref 0.0–0.1)
Basophil %: 0.5 %
Eosinophil #: 0 x10 3/mm (ref 0.0–0.7)
Eosinophil %: 0.1 %
HCT: 32.5 % — AB (ref 40.0–52.0)
HGB: 10.4 g/dL — ABNORMAL LOW (ref 13.0–18.0)
LYMPHS ABS: 1 x10 3/mm (ref 1.0–3.6)
LYMPHS PCT: 4.8 %
MCH: 29.7 pg (ref 26.0–34.0)
MCHC: 32 g/dL (ref 32.0–36.0)
MCV: 93 fL (ref 80–100)
Monocyte #: 0.5 x10 3/mm (ref 0.2–1.0)
Monocyte %: 2.5 %
NEUTROS PCT: 92.1 %
Neutrophil #: 19.9 x10 3/mm — ABNORMAL HIGH (ref 1.4–6.5)
Platelet: 235 x10 3/mm (ref 150–440)
RBC: 3.51 10*6/uL — AB (ref 4.40–5.90)
RDW: 18.2 % — ABNORMAL HIGH (ref 11.5–14.5)
WBC: 21.6 x10 3/mm — AB (ref 3.8–10.6)

## 2014-04-15 LAB — CBC CANCER CENTER
BASOS ABS: 0 x10 3/mm (ref 0.0–0.1)
Basophil %: 0.8 %
EOS PCT: 0.1 %
Eosinophil #: 0 x10 3/mm (ref 0.0–0.7)
HCT: 31 % — ABNORMAL LOW (ref 40.0–52.0)
HGB: 10.5 g/dL — ABNORMAL LOW (ref 13.0–18.0)
LYMPHS PCT: 11 %
Lymphocyte #: 0.4 x10 3/mm — ABNORMAL LOW (ref 1.0–3.6)
MCH: 30.3 pg (ref 26.0–34.0)
MCHC: 33.8 g/dL (ref 32.0–36.0)
MCV: 90 fL (ref 80–100)
MONOS PCT: 0.8 %
Monocyte #: 0 x10 3/mm — ABNORMAL LOW (ref 0.2–1.0)
NEUTROS ABS: 3.5 x10 3/mm (ref 1.4–6.5)
Neutrophil %: 87.3 %
Platelet: 183 x10 3/mm (ref 150–440)
RBC: 3.45 10*6/uL — ABNORMAL LOW (ref 4.40–5.90)
RDW: 18.2 % — ABNORMAL HIGH (ref 11.5–14.5)
WBC: 4 x10 3/mm (ref 3.8–10.6)

## 2014-04-16 LAB — PSA: PSA: 1144 ng/mL — AB (ref 0.0–4.0)

## 2014-04-29 LAB — CBC CANCER CENTER
BASOS ABS: 0.1 x10 3/mm (ref 0.0–0.1)
Basophil %: 0.4 %
Eosinophil #: 0 x10 3/mm (ref 0.0–0.7)
Eosinophil %: 0 %
HCT: 33.3 % — ABNORMAL LOW (ref 40.0–52.0)
HGB: 10.8 g/dL — ABNORMAL LOW (ref 13.0–18.0)
LYMPHS ABS: 1.1 x10 3/mm (ref 1.0–3.6)
LYMPHS PCT: 4.8 %
MCH: 29.7 pg (ref 26.0–34.0)
MCHC: 32.3 g/dL (ref 32.0–36.0)
MCV: 92 fL (ref 80–100)
MONO ABS: 0.6 x10 3/mm (ref 0.2–1.0)
Monocyte %: 2.8 %
NEUTROS PCT: 92 %
Neutrophil #: 21.3 x10 3/mm — ABNORMAL HIGH (ref 1.4–6.5)
Platelet: 287 x10 3/mm (ref 150–440)
RBC: 3.63 10*6/uL — ABNORMAL LOW (ref 4.40–5.90)
RDW: 19.9 % — ABNORMAL HIGH (ref 11.5–14.5)
WBC: 23.1 x10 3/mm — ABNORMAL HIGH (ref 3.8–10.6)

## 2014-04-29 LAB — COMPREHENSIVE METABOLIC PANEL
ALK PHOS: 66 U/L
AST: 14 U/L — AB (ref 15–37)
Albumin: 3.3 g/dL — ABNORMAL LOW (ref 3.4–5.0)
Anion Gap: 11 (ref 7–16)
BUN: 20 mg/dL — AB (ref 7–18)
Bilirubin,Total: 0.2 mg/dL (ref 0.2–1.0)
CALCIUM: 8.3 mg/dL — AB (ref 8.5–10.1)
Chloride: 103 mmol/L (ref 98–107)
Co2: 24 mmol/L (ref 21–32)
Creatinine: 0.95 mg/dL (ref 0.60–1.30)
EGFR (African American): >60
EGFR (Non-African Amer.): >60
Glucose: 117 mg/dL — ABNORMAL HIGH (ref 65–99)
Osmolality: 279 (ref 275–301)
POTASSIUM: 3.7 mmol/L (ref 3.5–5.1)
SGPT (ALT): 21 U/L (ref 12–78)
Sodium: 138 mmol/L (ref 136–145)
Total Protein: 7 g/dL (ref 6.4–8.2)

## 2014-04-30 LAB — PSA: PSA: 1200 ng/mL — AB (ref 0.0–4.0)

## 2014-05-03 ENCOUNTER — Ambulatory Visit: Payer: Self-pay | Admitting: Oncology

## 2014-05-20 LAB — CBC CANCER CENTER
BASOS ABS: 0.2 x10 3/mm — AB (ref 0.0–0.1)
Basophil %: 0.8 %
EOS PCT: 0 %
Eosinophil #: 0 x10 3/mm (ref 0.0–0.7)
HCT: 35.1 % — ABNORMAL LOW (ref 40.0–52.0)
HGB: 11.2 g/dL — AB (ref 13.0–18.0)
LYMPHS PCT: 6.7 %
Lymphocyte #: 1.5 x10 3/mm (ref 1.0–3.6)
MCH: 29.6 pg (ref 26.0–34.0)
MCHC: 31.8 g/dL — AB (ref 32.0–36.0)
MCV: 93 fL (ref 80–100)
MONO ABS: 0.5 x10 3/mm (ref 0.2–1.0)
Monocyte %: 2.3 %
Neutrophil #: 20.3 x10 3/mm — ABNORMAL HIGH (ref 1.4–6.5)
Neutrophil %: 90.2 %
PLATELETS: 221 x10 3/mm (ref 150–440)
RBC: 3.78 10*6/uL — ABNORMAL LOW (ref 4.40–5.90)
RDW: 21.1 % — ABNORMAL HIGH (ref 11.5–14.5)
WBC: 22.5 x10 3/mm — ABNORMAL HIGH (ref 3.8–10.6)

## 2014-05-20 LAB — COMPREHENSIVE METABOLIC PANEL
ALT: 21 U/L (ref 12–78)
ANION GAP: 12 (ref 7–16)
Albumin: 3.5 g/dL (ref 3.4–5.0)
Alkaline Phosphatase: 71 U/L
BILIRUBIN TOTAL: 0.2 mg/dL (ref 0.2–1.0)
BUN: 18 mg/dL (ref 7–18)
CALCIUM: 8.8 mg/dL (ref 8.5–10.1)
CHLORIDE: 103 mmol/L (ref 98–107)
Co2: 23 mmol/L (ref 21–32)
Creatinine: 1.01 mg/dL (ref 0.60–1.30)
EGFR (African American): >60
Glucose: 139 mg/dL — ABNORMAL HIGH (ref 65–99)
Osmolality: 280 (ref 275–301)
POTASSIUM: 4.2 mmol/L (ref 3.5–5.1)
SGOT(AST): 11 U/L — ABNORMAL LOW (ref 15–37)
SODIUM: 138 mmol/L (ref 136–145)
Total Protein: 7 g/dL (ref 6.4–8.2)

## 2014-05-21 LAB — PSA: PSA: 989 ng/mL — ABNORMAL HIGH (ref 0.0–4.0)

## 2014-06-02 ENCOUNTER — Ambulatory Visit: Payer: Self-pay | Admitting: Oncology

## 2014-06-10 LAB — CBC CANCER CENTER
BASOS ABS: 0.1 x10 3/mm (ref 0.0–0.1)
BASOS PCT: 0.7 %
EOS ABS: 0 x10 3/mm (ref 0.0–0.7)
EOS PCT: 0.1 %
HCT: 31.4 % — ABNORMAL LOW (ref 40.0–52.0)
HGB: 9.9 g/dL — ABNORMAL LOW (ref 13.0–18.0)
LYMPHS ABS: 2.1 x10 3/mm (ref 1.0–3.6)
LYMPHS PCT: 12.7 %
MCH: 29.4 pg (ref 26.0–34.0)
MCHC: 31.6 g/dL — AB (ref 32.0–36.0)
MCV: 93 fL (ref 80–100)
Monocyte #: 1.1 x10 3/mm — ABNORMAL HIGH (ref 0.2–1.0)
Monocyte %: 6.6 %
NEUTROS ABS: 13.2 x10 3/mm — AB (ref 1.4–6.5)
NEUTROS PCT: 79.9 %
Platelet: 267 x10 3/mm (ref 150–440)
RBC: 3.36 10*6/uL — ABNORMAL LOW (ref 4.40–5.90)
RDW: 21.6 % — ABNORMAL HIGH (ref 11.5–14.5)
WBC: 16.5 x10 3/mm — ABNORMAL HIGH (ref 3.8–10.6)

## 2014-06-10 LAB — COMPREHENSIVE METABOLIC PANEL
ALBUMIN: 2.9 g/dL — AB (ref 3.4–5.0)
Alkaline Phosphatase: 80 U/L
Anion Gap: 9 (ref 7–16)
BUN: 15 mg/dL (ref 7–18)
Bilirubin,Total: 0.3 mg/dL (ref 0.2–1.0)
CALCIUM: 8.7 mg/dL (ref 8.5–10.1)
CHLORIDE: 100 mmol/L (ref 98–107)
CO2: 26 mmol/L (ref 21–32)
Creatinine: 1.16 mg/dL (ref 0.60–1.30)
EGFR (African American): >60
EGFR (Non-African Amer.): >60
Glucose: 140 mg/dL — ABNORMAL HIGH (ref 65–99)
Osmolality: 273 (ref 275–301)
POTASSIUM: 4 mmol/L (ref 3.5–5.1)
SGOT(AST): 21 U/L (ref 15–37)
SGPT (ALT): 19 U/L (ref 12–78)
Sodium: 135 mmol/L — ABNORMAL LOW (ref 136–145)
Total Protein: 7.1 g/dL (ref 6.4–8.2)

## 2014-06-11 LAB — PSA: PSA: 1065 ng/mL — ABNORMAL HIGH (ref 0.0–4.0)

## 2014-06-17 LAB — COMPREHENSIVE METABOLIC PANEL
ANION GAP: 13 (ref 7–16)
AST: 14 U/L — AB (ref 15–37)
Albumin: 3.1 g/dL — ABNORMAL LOW (ref 3.4–5.0)
Alkaline Phosphatase: 72 U/L
BUN: 17 mg/dL (ref 7–18)
Bilirubin,Total: 0.2 mg/dL (ref 0.2–1.0)
CHLORIDE: 102 mmol/L (ref 98–107)
CREATININE: 1 mg/dL (ref 0.60–1.30)
Calcium, Total: 8.3 mg/dL — ABNORMAL LOW (ref 8.5–10.1)
Co2: 22 mmol/L (ref 21–32)
Glucose: 118 mg/dL — ABNORMAL HIGH (ref 65–99)
Osmolality: 276 (ref 275–301)
Potassium: 4 mmol/L (ref 3.5–5.1)
SGPT (ALT): 35 U/L (ref 12–78)
SODIUM: 137 mmol/L (ref 136–145)
Total Protein: 6.6 g/dL (ref 6.4–8.2)

## 2014-06-17 LAB — CBC CANCER CENTER
BASOS ABS: 0.1 x10 3/mm (ref 0.0–0.1)
Basophil %: 0.8 %
Eosinophil #: 0 x10 3/mm (ref 0.0–0.7)
Eosinophil %: 0 %
HCT: 32.7 % — AB (ref 40.0–52.0)
HGB: 10.5 g/dL — AB (ref 13.0–18.0)
LYMPHS PCT: 5.1 %
Lymphocyte #: 0.9 x10 3/mm — ABNORMAL LOW (ref 1.0–3.6)
MCH: 29.9 pg (ref 26.0–34.0)
MCHC: 32.1 g/dL (ref 32.0–36.0)
MCV: 93 fL (ref 80–100)
MONO ABS: 0.3 x10 3/mm (ref 0.2–1.0)
Monocyte %: 1.9 %
NEUTROS ABS: 16.2 x10 3/mm — AB (ref 1.4–6.5)
Neutrophil %: 92.2 %
Platelet: 281 x10 3/mm (ref 150–440)
RBC: 3.52 10*6/uL — AB (ref 4.40–5.90)
RDW: 21.9 % — ABNORMAL HIGH (ref 11.5–14.5)
WBC: 17.6 x10 3/mm — AB (ref 3.8–10.6)

## 2014-07-03 ENCOUNTER — Ambulatory Visit: Payer: Self-pay | Admitting: Oncology

## 2014-07-08 LAB — CBC CANCER CENTER
Basophil #: 0 x10 3/mm (ref 0.0–0.1)
Basophil %: 0.2 %
EOS PCT: 0 %
Eosinophil #: 0 x10 3/mm (ref 0.0–0.7)
HCT: 34 % — ABNORMAL LOW (ref 40.0–52.0)
HGB: 10.7 g/dL — ABNORMAL LOW (ref 13.0–18.0)
LYMPHS PCT: 5 %
Lymphocyte #: 1 x10 3/mm (ref 1.0–3.6)
MCH: 30.1 pg (ref 26.0–34.0)
MCHC: 31.6 g/dL — ABNORMAL LOW (ref 32.0–36.0)
MCV: 95 fL (ref 80–100)
MONOS PCT: 1.9 %
Monocyte #: 0.4 x10 3/mm (ref 0.2–1.0)
NEUTROS ABS: 18.8 x10 3/mm — AB (ref 1.4–6.5)
Neutrophil %: 92.9 %
PLATELETS: 237 x10 3/mm (ref 150–440)
RBC: 3.57 10*6/uL — ABNORMAL LOW (ref 4.40–5.90)
RDW: 21.7 % — ABNORMAL HIGH (ref 11.5–14.5)
WBC: 20.3 x10 3/mm — ABNORMAL HIGH (ref 3.8–10.6)

## 2014-07-08 LAB — COMPREHENSIVE METABOLIC PANEL
Albumin: 3.4 g/dL (ref 3.4–5.0)
Alkaline Phosphatase: 71 U/L
Anion Gap: 14 (ref 7–16)
BILIRUBIN TOTAL: 0.3 mg/dL (ref 0.2–1.0)
BUN: 17 mg/dL (ref 7–18)
CO2: 21 mmol/L (ref 21–32)
Calcium, Total: 8.3 mg/dL — ABNORMAL LOW (ref 8.5–10.1)
Chloride: 99 mmol/L (ref 98–107)
Creatinine: 1.32 mg/dL — ABNORMAL HIGH (ref 0.60–1.30)
EGFR (African American): >60
EGFR (Non-African Amer.): 52 — ABNORMAL LOW
GLUCOSE: 178 mg/dL — AB (ref 65–99)
OSMOLALITY: 274 (ref 275–301)
Potassium: 3.9 mmol/L (ref 3.5–5.1)
SGOT(AST): 14 U/L — ABNORMAL LOW (ref 15–37)
SGPT (ALT): 24 U/L
SODIUM: 134 mmol/L — AB (ref 136–145)
TOTAL PROTEIN: 6.8 g/dL (ref 6.4–8.2)

## 2014-07-10 LAB — PSA: PSA: 712.5 ng/mL — AB (ref 0.0–4.0)

## 2014-07-29 LAB — COMPREHENSIVE METABOLIC PANEL
ALBUMIN: 3.1 g/dL — AB (ref 3.4–5.0)
AST: 12 U/L — AB (ref 15–37)
Alkaline Phosphatase: 63 U/L
Anion Gap: 13 (ref 7–16)
BILIRUBIN TOTAL: 0.2 mg/dL (ref 0.2–1.0)
BUN: 14 mg/dL (ref 7–18)
CALCIUM: 8.2 mg/dL — AB (ref 8.5–10.1)
Chloride: 101 mmol/L (ref 98–107)
Co2: 23 mmol/L (ref 21–32)
Creatinine: 1.1 mg/dL (ref 0.60–1.30)
EGFR (Non-African Amer.): >60
GLUCOSE: 132 mg/dL — AB (ref 65–99)
Osmolality: 276 (ref 275–301)
POTASSIUM: 4.1 mmol/L (ref 3.5–5.1)
SGPT (ALT): 18 U/L
SODIUM: 137 mmol/L (ref 136–145)
Total Protein: 6.5 g/dL (ref 6.4–8.2)

## 2014-07-29 LAB — CBC CANCER CENTER
BASOS PCT: 0.4 %
Basophil #: 0.1 x10 3/mm (ref 0.0–0.1)
Eosinophil #: 0 x10 3/mm (ref 0.0–0.7)
Eosinophil %: 0 %
HCT: 34.2 % — AB (ref 40.0–52.0)
HGB: 10.8 g/dL — ABNORMAL LOW (ref 13.0–18.0)
LYMPHS PCT: 6.6 %
Lymphocyte #: 1.5 x10 3/mm (ref 1.0–3.6)
MCH: 30.4 pg (ref 26.0–34.0)
MCHC: 31.5 g/dL — ABNORMAL LOW (ref 32.0–36.0)
MCV: 96 fL (ref 80–100)
Monocyte #: 0.5 x10 3/mm (ref 0.2–1.0)
Monocyte %: 2.4 %
Neutrophil #: 20.1 x10 3/mm — ABNORMAL HIGH (ref 1.4–6.5)
Neutrophil %: 90.6 %
PLATELETS: 232 x10 3/mm (ref 150–440)
RBC: 3.54 10*6/uL — ABNORMAL LOW (ref 4.40–5.90)
RDW: 21 % — AB (ref 11.5–14.5)
WBC: 22.2 x10 3/mm — AB (ref 3.8–10.6)

## 2014-08-02 LAB — PSA: PSA: 645.2 ng/mL — ABNORMAL HIGH (ref 0.0–4.0)

## 2014-08-03 ENCOUNTER — Ambulatory Visit: Payer: Self-pay | Admitting: Oncology

## 2014-08-12 LAB — CBC CANCER CENTER
Basophil #: 0.1 x10 3/mm (ref 0.0–0.1)
Basophil %: 1 %
Eosinophil #: 0.3 x10 3/mm (ref 0.0–0.7)
Eosinophil %: 2.4 %
HCT: 34.5 % — AB (ref 40.0–52.0)
HGB: 10.9 g/dL — AB (ref 13.0–18.0)
Lymphocyte #: 2.4 x10 3/mm (ref 1.0–3.6)
Lymphocyte %: 20.3 %
MCH: 31.2 pg (ref 26.0–34.0)
MCHC: 31.7 g/dL — AB (ref 32.0–36.0)
MCV: 99 fL (ref 80–100)
MONO ABS: 0.7 x10 3/mm (ref 0.2–1.0)
MONOS PCT: 6.3 %
NEUTROS ABS: 8.3 x10 3/mm — AB (ref 1.4–6.5)
Neutrophil %: 70 %
PLATELETS: 196 x10 3/mm (ref 150–440)
RBC: 3.5 10*6/uL — AB (ref 4.40–5.90)
RDW: 20.6 % — ABNORMAL HIGH (ref 11.5–14.5)
WBC: 11.9 x10 3/mm — ABNORMAL HIGH (ref 3.8–10.6)

## 2014-08-12 LAB — BASIC METABOLIC PANEL
ANION GAP: 8 (ref 7–16)
BUN: 15 mg/dL (ref 7–18)
Calcium, Total: 8.2 mg/dL — ABNORMAL LOW (ref 8.5–10.1)
Chloride: 103 mmol/L (ref 98–107)
Co2: 26 mmol/L (ref 21–32)
Creatinine: 1.06 mg/dL (ref 0.60–1.30)
EGFR (African American): >60
EGFR (Non-African Amer.): >60
GLUCOSE: 110 mg/dL — AB (ref 65–99)
Osmolality: 275 (ref 275–301)
POTASSIUM: 3.9 mmol/L (ref 3.5–5.1)
Sodium: 137 mmol/L (ref 136–145)

## 2014-09-02 ENCOUNTER — Ambulatory Visit: Payer: Self-pay | Admitting: Oncology

## 2014-09-02 LAB — CBC CANCER CENTER
BASOS ABS: 0.1 x10 3/mm (ref 0.0–0.1)
BASOS PCT: 0.7 %
EOS PCT: 0.3 %
Eosinophil #: 0 x10 3/mm (ref 0.0–0.7)
HCT: 34.8 % — ABNORMAL LOW (ref 40.0–52.0)
HGB: 11.1 g/dL — AB (ref 13.0–18.0)
Lymphocyte #: 2.7 x10 3/mm (ref 1.0–3.6)
Lymphocyte %: 16.9 %
MCH: 31.1 pg (ref 26.0–34.0)
MCHC: 31.8 g/dL — AB (ref 32.0–36.0)
MCV: 98 fL (ref 80–100)
Monocyte #: 0.9 x10 3/mm (ref 0.2–1.0)
Monocyte %: 5.8 %
NEUTROS PCT: 76.3 %
Neutrophil #: 12.2 x10 3/mm — ABNORMAL HIGH (ref 1.4–6.5)
PLATELETS: 331 x10 3/mm (ref 150–440)
RBC: 3.56 10*6/uL — ABNORMAL LOW (ref 4.40–5.90)
RDW: 18.5 % — AB (ref 11.5–14.5)
WBC: 16 x10 3/mm — AB (ref 3.8–10.6)

## 2014-09-02 LAB — COMPREHENSIVE METABOLIC PANEL
Albumin: 3.2 g/dL — ABNORMAL LOW (ref 3.4–5.0)
Alkaline Phosphatase: 72 U/L
Anion Gap: 12 (ref 7–16)
BUN: 16 mg/dL (ref 7–18)
Bilirubin,Total: 0.2 mg/dL (ref 0.2–1.0)
CALCIUM: 8.6 mg/dL (ref 8.5–10.1)
CO2: 23 mmol/L (ref 21–32)
Chloride: 104 mmol/L (ref 98–107)
Creatinine: 1.1 mg/dL (ref 0.60–1.30)
Glucose: 120 mg/dL — ABNORMAL HIGH (ref 65–99)
OSMOLALITY: 280 (ref 275–301)
Potassium: 4 mmol/L (ref 3.5–5.1)
SGOT(AST): 18 U/L (ref 15–37)
SGPT (ALT): 20 U/L
Sodium: 139 mmol/L (ref 136–145)
TOTAL PROTEIN: 6.7 g/dL (ref 6.4–8.2)

## 2014-09-04 LAB — PSA: PSA: 504.8 ng/mL — AB (ref 0.0–4.0)

## 2014-09-24 LAB — CBC CANCER CENTER
BASOS ABS: 0.2 x10 3/mm — AB (ref 0.0–0.1)
Basophil %: 0.9 %
Eosinophil #: 0.1 x10 3/mm (ref 0.0–0.7)
Eosinophil %: 0.5 %
HCT: 34.5 % — ABNORMAL LOW (ref 40.0–52.0)
HGB: 11 g/dL — ABNORMAL LOW (ref 13.0–18.0)
Lymphocyte #: 2.9 x10 3/mm (ref 1.0–3.6)
Lymphocyte %: 16.1 %
MCH: 31.2 pg (ref 26.0–34.0)
MCHC: 31.9 g/dL — ABNORMAL LOW (ref 32.0–36.0)
MCV: 98 fL (ref 80–100)
Monocyte #: 1 x10 3/mm (ref 0.2–1.0)
Monocyte %: 5.5 %
NEUTROS PCT: 77 %
Neutrophil #: 13.7 x10 3/mm — ABNORMAL HIGH (ref 1.4–6.5)
Platelet: 211 x10 3/mm (ref 150–440)
RBC: 3.53 10*6/uL — ABNORMAL LOW (ref 4.40–5.90)
RDW: 17.7 % — ABNORMAL HIGH (ref 11.5–14.5)
WBC: 17.8 x10 3/mm — ABNORMAL HIGH (ref 3.8–10.6)

## 2014-09-24 LAB — COMPREHENSIVE METABOLIC PANEL
ALK PHOS: 63 U/L
ANION GAP: 10 (ref 7–16)
Albumin: 3.4 g/dL (ref 3.4–5.0)
BUN: 17 mg/dL (ref 7–18)
Bilirubin,Total: 0.3 mg/dL (ref 0.2–1.0)
CHLORIDE: 105 mmol/L (ref 98–107)
Calcium, Total: 8.7 mg/dL (ref 8.5–10.1)
Co2: 24 mmol/L (ref 21–32)
Creatinine: 0.95 mg/dL (ref 0.60–1.30)
GLUCOSE: 124 mg/dL — AB (ref 65–99)
OSMOLALITY: 281 (ref 275–301)
Potassium: 4.3 mmol/L (ref 3.5–5.1)
SGOT(AST): 17 U/L (ref 15–37)
SGPT (ALT): 16 U/L
Sodium: 139 mmol/L (ref 136–145)
Total Protein: 6.7 g/dL (ref 6.4–8.2)

## 2014-09-27 LAB — PSA: PSA: 394.8 ng/mL — ABNORMAL HIGH (ref 0.0–4.0)

## 2014-10-03 ENCOUNTER — Ambulatory Visit: Payer: Self-pay | Admitting: Oncology

## 2014-10-15 LAB — CBC CANCER CENTER
BASOS PCT: 0.7 %
Basophil #: 0.1 x10 3/mm (ref 0.0–0.1)
EOS ABS: 0.1 x10 3/mm (ref 0.0–0.7)
Eosinophil %: 0.4 %
HCT: 32.9 % — ABNORMAL LOW (ref 40.0–52.0)
HGB: 10.8 g/dL — AB (ref 13.0–18.0)
Lymphocyte #: 2.1 x10 3/mm (ref 1.0–3.6)
Lymphocyte %: 12.4 %
MCH: 31.5 pg (ref 26.0–34.0)
MCHC: 32.9 g/dL (ref 32.0–36.0)
MCV: 96 fL (ref 80–100)
MONOS PCT: 6 %
Monocyte #: 1 x10 3/mm (ref 0.2–1.0)
NEUTROS ABS: 13.6 x10 3/mm — AB (ref 1.4–6.5)
Neutrophil %: 80.5 %
Platelet: 236 x10 3/mm (ref 150–440)
RBC: 3.44 10*6/uL — AB (ref 4.40–5.90)
RDW: 17.6 % — ABNORMAL HIGH (ref 11.5–14.5)
WBC: 16.9 x10 3/mm — AB (ref 3.8–10.6)

## 2014-10-15 LAB — COMPREHENSIVE METABOLIC PANEL
AST: 12 U/L — AB (ref 15–37)
Albumin: 3.3 g/dL — ABNORMAL LOW (ref 3.4–5.0)
Alkaline Phosphatase: 73 U/L
Anion Gap: 10 (ref 7–16)
BILIRUBIN TOTAL: 0.2 mg/dL (ref 0.2–1.0)
BUN: 20 mg/dL — ABNORMAL HIGH (ref 7–18)
CALCIUM: 9 mg/dL (ref 8.5–10.1)
CHLORIDE: 102 mmol/L (ref 98–107)
CO2: 27 mmol/L (ref 21–32)
Creatinine: 1.01 mg/dL (ref 0.60–1.30)
EGFR (African American): >60
Glucose: 119 mg/dL — ABNORMAL HIGH (ref 65–99)
Osmolality: 281 (ref 275–301)
Potassium: 4.4 mmol/L (ref 3.5–5.1)
SGPT (ALT): 14 U/L
Sodium: 139 mmol/L (ref 136–145)
TOTAL PROTEIN: 6.8 g/dL (ref 6.4–8.2)

## 2014-10-19 LAB — PSA: PSA: 364 ng/mL — ABNORMAL HIGH (ref 0.0–4.0)

## 2014-11-02 ENCOUNTER — Ambulatory Visit: Payer: Self-pay | Admitting: Oncology

## 2014-11-05 LAB — CBC CANCER CENTER
BASOS ABS: 0.1 x10 3/mm (ref 0.0–0.1)
Basophil %: 0.5 %
EOS ABS: 0.1 x10 3/mm (ref 0.0–0.7)
Eosinophil %: 0.4 %
HCT: 32.9 % — ABNORMAL LOW (ref 40.0–52.0)
HGB: 10.7 g/dL — ABNORMAL LOW (ref 13.0–18.0)
LYMPHS PCT: 10.2 %
Lymphocyte #: 2 x10 3/mm (ref 1.0–3.6)
MCH: 31.1 pg (ref 26.0–34.0)
MCHC: 32.6 g/dL (ref 32.0–36.0)
MCV: 96 fL (ref 80–100)
MONO ABS: 1 x10 3/mm (ref 0.2–1.0)
Monocyte %: 4.9 %
Neutrophil #: 16.5 x10 3/mm — ABNORMAL HIGH (ref 1.4–6.5)
Neutrophil %: 84 %
Platelet: 258 x10 3/mm (ref 150–440)
RBC: 3.45 10*6/uL — ABNORMAL LOW (ref 4.40–5.90)
RDW: 17.7 % — AB (ref 11.5–14.5)
WBC: 19.7 x10 3/mm — ABNORMAL HIGH (ref 3.8–10.6)

## 2014-11-05 LAB — COMPREHENSIVE METABOLIC PANEL
ALK PHOS: 81 U/L
ALT: 14 U/L
Albumin: 3.2 g/dL — ABNORMAL LOW (ref 3.4–5.0)
Anion Gap: 11 (ref 7–16)
BUN: 13 mg/dL (ref 7–18)
Bilirubin,Total: 0.2 mg/dL (ref 0.2–1.0)
CALCIUM: 8.6 mg/dL (ref 8.5–10.1)
CO2: 26 mmol/L (ref 21–32)
Chloride: 101 mmol/L (ref 98–107)
Creatinine: 1.19 mg/dL (ref 0.60–1.30)
Glucose: 131 mg/dL — ABNORMAL HIGH (ref 65–99)
Osmolality: 278 (ref 275–301)
Potassium: 3.9 mmol/L (ref 3.5–5.1)
SGOT(AST): 11 U/L — ABNORMAL LOW (ref 15–37)
Sodium: 138 mmol/L (ref 136–145)
TOTAL PROTEIN: 6.8 g/dL (ref 6.4–8.2)

## 2014-11-08 LAB — PSA: PSA: 429.4 ng/mL — ABNORMAL HIGH (ref 0.0–4.0)

## 2014-12-03 ENCOUNTER — Ambulatory Visit: Payer: Self-pay | Admitting: Oncology

## 2014-12-09 LAB — LACTATE DEHYDROGENASE: LDH: 163 U/L (ref 85–241)

## 2014-12-09 LAB — CBC CANCER CENTER
Basophil #: 0.1 x10 3/mm (ref 0.0–0.1)
Basophil %: 0.7 %
Eosinophil #: 0.3 x10 3/mm (ref 0.0–0.7)
Eosinophil %: 2.8 %
HCT: 36.6 % — ABNORMAL LOW (ref 40.0–52.0)
HGB: 11.9 g/dL — ABNORMAL LOW (ref 13.0–18.0)
Lymphocyte #: 2.3 x10 3/mm (ref 1.0–3.6)
Lymphocyte %: 25 %
MCH: 31.2 pg (ref 26.0–34.0)
MCHC: 32.6 g/dL (ref 32.0–36.0)
MCV: 96 fL (ref 80–100)
MONO ABS: 0.5 x10 3/mm (ref 0.2–1.0)
Monocyte %: 5.2 %
NEUTROS ABS: 6 x10 3/mm (ref 1.4–6.5)
Neutrophil %: 66.3 %
Platelet: 192 x10 3/mm (ref 150–440)
RBC: 3.82 10*6/uL — AB (ref 4.40–5.90)
RDW: 16.5 % — ABNORMAL HIGH (ref 11.5–14.5)
WBC: 9.1 x10 3/mm (ref 3.8–10.6)

## 2014-12-09 LAB — COMPREHENSIVE METABOLIC PANEL
ALK PHOS: 78 U/L
ALT: 15 U/L
Albumin: 3.4 g/dL (ref 3.4–5.0)
Anion Gap: 7 (ref 7–16)
BILIRUBIN TOTAL: 0.2 mg/dL (ref 0.2–1.0)
BUN: 16 mg/dL (ref 7–18)
CO2: 29 mmol/L (ref 21–32)
CREATININE: 1.1 mg/dL (ref 0.60–1.30)
Calcium, Total: 8.4 mg/dL — ABNORMAL LOW (ref 8.5–10.1)
Chloride: 104 mmol/L (ref 98–107)
EGFR (African American): >60
Glucose: 114 mg/dL — ABNORMAL HIGH (ref 65–99)
OSMOLALITY: 281 (ref 275–301)
Potassium: 4.2 mmol/L (ref 3.5–5.1)
SGOT(AST): 12 U/L — ABNORMAL LOW (ref 15–37)
SODIUM: 140 mmol/L (ref 136–145)
TOTAL PROTEIN: 6.8 g/dL (ref 6.4–8.2)

## 2014-12-10 LAB — PSA: PSA: 480.8 ng/mL — ABNORMAL HIGH (ref 0.0–4.0)

## 2015-01-03 ENCOUNTER — Ambulatory Visit: Payer: Self-pay | Admitting: Oncology

## 2015-03-22 NOTE — Op Note (Signed)
PATIENT NAME:  Shannon, Spencer MR#:  [id number] DATE OF BIRTH:  07/14/1936  DATE OF PROCEDURE:  08/22/2012  PREOPERATIVE DIAGNOSES:  1. Abdominal aortic aneurysm greater than 5 cm.  2. History of skin cancer, status post resection with free flap reconstruction of the nares.  3. Hypertension.   POSTOPERATIVE DIAGNOSES: 1. Abdominal aortic aneurysm greater than 5 cm.  2. History of skin cancer, status post resection with free flap reconstruction of the nares.  3. Hypertension.   PROCEDURES:  1. Ultrasound-guided vascular access of the right and left femoral arteries bilaterally.  2. Introduction catheter into aorta, bilateral femoral artery approaches percutaneously.  3. Repair of abdominal aortic aneurysm using Gore Excluder C3 endoprosthesis 23 mm diameter main body.  4. Bilateral Perclose closure of femoral arteries.   CO-SURGEONS:  Festus Barren, MD and Levora Dredge, MD.    ANESTHESIA: General by endotracheal intubation.   FLUIDS: Per anesthesia record.  ESTIMATED BLOOD LOSS: 25 mL.  CONTRAST USED: Isovue 63 mL.  FLUORO TIME: 11.4 minutes.   INDICATIONS: Mr. Swoveland is a 79 year old gentleman who has been followed in the office by Dr. Wyn Quaker and has now had an increase in size of his abdominal aortic aneurysm, which measures greater than 5 cm. He is, therefore, being brought in for endovascular repair to prevent lethal rupture. The risks and benefits have been reviewed. All questions have been answered and the patient has agreed to proceed with surgery.   DESCRIPTION OF PROCEDURE: The patient is taken to special procedures and placed in the supine position. After adequate general anesthesia is induced and appropriate invasive monitors are placed, he is prepped from the nipple line to the knees and draped in a sterile fashion.   Ultrasound is placed in a sterile sleeve. Ultrasound is utilized secondary to lack of appropriate landmarks and to avoid vascular injury. With Dr. Wyn Quaker working on  the patient's right and myself on the left, the right groin is accessed. The common femoral artery is identified. It is echolucent and pulsatile indicating patency. Image is recorded for the permanent record and using a Seldinger needle, access is obtained to the common femoral artery. J-wire is advanced followed by placement of an 8 French sheath. Pigtail catheter is then advanced over the wire from the right side.   With myself working on the left, ultrasound is utilized. Image is recorded. Artery is noted to be echolucent and pulsatile indicating patency and the puncture is made with direct ultrasound visualization. Microneedle followed by microwire and microsheath followed by J-wire, 8 French sheath and a Kumpe catheter is advanced.   A bolus injection of contrast is then utilized via the pigtail catheter. Final length measurements are made and a Gore Excluder C3 endoprosthesis 23 mm diameter main body, 14 mm right limb is then selected and the pigtail catheter is utilized to advance an Amplatz superstiff wire.   The main body is then advanced up to the level of the renals. A second injection is made through the Kumpe to verify the location and the main body is opened down to expose the contralateral gate.   Working from my sheath on this side using the KMP catheter and a Glidewire, the contralateral gate is engaged. Glidewire is advanced. Pigtail catheter is then exchanged for the Kumpe, twirled in the main body to verify luminal placement and then advanced up. Amplatz superstiff wire is then advanced through the pigtail catheter and with the pigtail catheter in position, reflux contrast is then obtained to demonstrate  the iliac bifurcation. A 14 x 12 contralateral limb is then selected, opened onto the field and deployed without difficulty after upsizing to a 12 [place]French sheath.   Coda balloon is then advanced up both the right and left side and following which the pigtail catheter was advanced up the  right side and a bolus injection of contrast was performed just above the renals with delayed imaging demonstrating a very small type II endoleak via paired lumbars. There were no type I or type III endoleaks.   It should be noted prior to placing the 8 French sheaths, Perclose devices were deployed. Two Perclose devices were deployed for each groin in 90 degree orientations in a pre-closed fashion.   Working on the right side initially, the previously placed Percloses are then secured and excellent hemostasis is noted. Then working on the left side, sheath is removed, wire is removed and the Perclose devices are secured as directed with excellent hemostasis.   Both groins are then inspected and sterile dressings are applied. There are no immediate complications.   INTERPRETATION: Initial views demonstrate the aneurysm renal arteries are localized. Measurements are made. Following placement of the endoprosthesis with delayed run out, there is a very small type II endoleak, but there are no type I or type III endoleaks.   SUMMARY: Successful endovascular occlusion of infrarenal greater than 5 cm abdominal aortic aneurysm.   ____________________________ Renford DillsGregory G. Schnier, MD ggs:ap D: 08/26/2012 11:11:40 ET T: 08/26/2012 13:13:48 ET JOB#: [id number]  cc: Renford DillsGregory G. Schnier, MD, <Dictator> Renford DillsGREGORY G SCHNIER MD ELECTRONICALLY SIGNED 09/02/2012 14:05

## 2015-03-22 NOTE — Op Note (Signed)
PATIENT NAME:  Shannon Spencer, Shannon Spencer MR#:  [id number] DATE OF BIRTH:  [birth year]  DATE OF PROCEDURE:  08/22/2012  PREOPERATIVE DIAGNOSIS:  1. Greater than 5 cm abdominal aortic aneurysm.   2. Skin cancer.  3. Hypertension.   POSTOPERATIVE DIAGNOSIS:   1. Greater than 5 cm abdominal aortic aneurysm.  2. Skin cancer.  3. Hypertension.    PROCEDURES:  1. Ultrasound guidance for vascular access of right and left femoral arteries, right by Dr. Wyn Quakerew, left by Dr. Gilda CreaseSchnier.   2. Catheter placement into aorta from bilateral femoral approaches, right by Dr. Wyn Quakerew, left by Dr. Gilda CreaseSchnier.   3. Placement of a Gore Excluder C3 endoprosthesis, 23 mm diameter main body, 14 mm diameter right iliac limb and left iliac limb, main body right.  4. Perclose closure device to bilateral femoral arteries, right by Dr. Wyn Quakerew, left by Dr. Gilda CreaseSchnier.   CO-SURGEONS:  Festus BarrenJason Geo Slone, MD and Renford DillsGregory G. Schnier, MD   ANESTHESIA: General.   ESTIMATED BLOOD LOSS: 25 mL.   INDICATION FOR PROCEDURE: The patient is a 79 year old male who had adequate anatomy for an endovascular repair and greater than 5 cm aneurysm. He is brought in for definitive repair. The risks and benefits were discussed. Informed consent was obtained.   DESCRIPTION OF PROCEDURE: The patient was brought to the Vascular Interventional Radiology Suite with the help of our Anesthesia colleagues providing a general anesthetic. His abdomen and groins were sterilely prepped and draped and a sterile surgical field was created. Due to his favorable anatomy for totally percutaneous approach, ultrasound guidance was used to gain access to the femoral arteries bilaterally, and two Perclose devices were placed in a preclosed fashion, the right by myself and the left by Dr. Gilda CreaseSchnier. Then a 12-French sheath was placed on the left and an 18-French sheath was placed on the right over a stiff wire. A pigtail catheter was placed in the aorta from the right femoral approach which showed our  anatomy, and then I placed the main body up the right side while Dr. Gilda CreaseSchnier put a Kumpe catheter up on the left. An LAO and cranial projection was performed, and we deployed the stent graft just below the right renal artery, which was lower reduction. Dr. Gilda CreaseSchnier cannulated the contralateral gate with a mild amount of difficulty with a Glidewire and the Kumpe catheter and confirmed intraluminal flow by twirling the catheter in the main body after exchange for a pigtail catheter. With the pigtail in place, a retrograde arteriogram was performed to the left femoral sheath and a 14 mm diameter x 12 cm limb was selected. This was deployed. I completed the main body clip deployment. The Compliant Balloon was used to iron out all junction points and seal zones. A completion angiogram showed excellent flow through the stent graft with no type 1 or 3 endoleaks. There was a type 2 endoleak that was seen through lumbar collaterals. At this point, we elected to terminate the procedure. Both sheaths were removed. The Perclose closures were performed in a staged fashion, first on the left by Dr. Gilda CreaseSchnier, and then on the right by myself with excellent hemostatic result. Sterile dressing was placed. The patient tolerated the procedure well and was taken to the recovery room in stable condition.  ____________________________ Annice NeedyJason S. Jahkai Yandell, MD jsd:cbb D: 08/25/2012 13:05:17 ET T: 08/25/2012 13:47:25 ET JOB#: [id number]  cc: Annice NeedyJason S. Skyler Carel, MD, <Dictator> Silas FloodSheikh A. Ellsworth Lennoxejan-Sie, MD Annice NeedyJASON S Tellis Spivak MD ELECTRONICALLY SIGNED 08/28/2012 13:42

## 2015-03-25 NOTE — Op Note (Signed)
PATIENT NAME:  Shannon Spencer, Shannon Spencer MR#:  [id number] DATE OF BIRTH:  1936-01-18  DATE OF PROCEDURE:  11/09/2013  PREOPERATIVE DIAGNOSIS: Cataract, right eye.   POSTOPERATIVE DIAGNOSIS: Cataract, right eye.  PROCEDURE PERFORMED: Extracapsular cataract extraction using phacoemulsification with placement of Alcon SN6CWS, 21.5-diopter posterior chamber lens, serial number [id number].  SURGEON: Maylon PeppersSteven A. Zaevion Parke, M.D.   ANESTHESIA: Lidocaine 4% and 0.75% Marcaine a 50-50 mixture with 10 units/mL of HyoMax added, given as a peribulbar.   ANESTHESIOLOGIST: Dr. Henrene HawkingKephart.   COMPLICATIONS: None.   ESTIMATED BLOOD LOSS: Less than 1 mL.   DESCRIPTION OF PROCEDURE: The patient was brought to the operating room and given a peribulbar block.  The patient was then prepped and draped in the usual fashion.  The vertical rectus muscles were imbricated using 5-0 silk sutures.  These sutures were then clamped to the sterile drapes as bridle sutures.  A limbal peritomy was performed extending two clock hours and hemostasis was obtained with cautery.  A partial thickness scleral groove was made at the surgical limbus and dissected anteriorly in a lamellar dissection using an Alcon crescent knife.  The anterior chamber was entered superonasally with a Superblade and through the lamellar dissection with a 2.6 mm keratome.  DisCoVisc was used to replace the aqueous and a continuous tear capsulorrhexis was carried out.  Hydrodissection and hydrodelineation were carried out with balanced salt and a 27 gauge canula.  The nucleus was rotated to confirm the effectiveness of the hydrodissection.  Phacoemulsification was carried out using a divide-and-conquer technique.  Total ultrasound time was 1 minute and 49 seconds with an average power of  25.4%. CDE 48.53.  Irrigation/aspiration was used to remove the residual cortex.  DisCoVisc was used to inflate the capsule and the internal incision was enlarged to 3 mm with the  crescent knife.  The intraocular lens was folded and inserted into the capsular bag using the AcrySert delivery system.  Irrigation/aspiration was used to remove the residual DisCoVisc.  Miostat was injected into the anterior chamber through the paracentesis track to inflate the anterior chamber and induce miosis.  A tenth of a mL of cefuroxime containing 1 mg of drug was injected via the paracentesis track. The wound was checked for leaks and none were found. The conjunctiva was closed with cautery and the bridle sutures were removed.  Two drops of 0.3% Vigamox were placed on the eye.   An eye shield was placed on the eye.  The patient was discharged to the recovery room in good condition.  ____________________________ Maylon PeppersSteven A. Lempi Edwin, MD sad:aw D: 11/09/2013 13:28:36 ET T: 11/09/2013 13:37:08 ET JOB#: [id number]  cc: Viviann SpareSteven A. Aran Menning, MD, <Dictator> Erline LevineSTEVEN A Lynnlee Revels MD ELECTRONICALLY SIGNED 11/16/2013 12:47

## 2015-03-25 NOTE — Op Note (Signed)
PATIENT NAME:  Shannon Spencer, Shannon Spencer DATE OF BIRTH:  23-Aug-1936  DATE OF PROCEDURE:  07/13/2013  PREOPERATIVE DIAGNOSIS: Cataract, left eye.   POSTOPERATIVE DIAGNOSIS: Cataract, left eye.   PROCEDURE PERFORMED: Extracapsular cataract extraction using phacoemulsification with placement Alcon of SN6CWS, 22.0-diopter posterior chamber lens, serial number [id number].   SURGEON: Maylon PeppersSteven A. Jahnyla Parrillo, MD   ANESTHESIA: 4% lidocaine and 0.75% Marcaine in a 50-50 mixture with 10 units/mL of Hylenex added, given as a peribulbar.   ANESTHESIOLOGIST: Gjibertus F. Darleene CleaverVan Staveren, MD  COMPLICATIONS: None.   ESTIMATED BLOOD LOSS: Less than 1 mL.   DESCRIPTION OF PROCEDURE: The patient was brought to the Operating Room and given a peribulbar block and IV sedation. He was prepped and draped in the usual fashion. The vertical rectus muscles were imbricated using 5-0 silk sutures, bridle sutures. A limbal peritomy was carried out superiorly for 1 clock hour and hemostasis was obtained with cautery. A partial-thickness scleral groove was made in the posterior surgical limbus and dissected anteriorly into clear cornea with an Alcon crescent knife. The anterior chamber was entered superonasally and superotemporally with a paracentesis knife. Air was used to replace the aqueous and VisionBlue was then introduced into the anterior chamber and used to paint the capsule. Balanced salt was used to evacuate the air and then DisCoVisc was injected through one of the paracentesis tracts. The anterior chamber was then entered through the lamellar dissection with a 2.6 mm keratome and DisCoVisc was then used to fill the rest of the chamber and evacuate the small amount of remaining air. A continuous tear circular capsulorrhexis was carried out and hydrodelineation was used to loosen the nucleus. Phacoemulsification was carried out in a divide-and-conquer technique. The lens was very hard and the ultrasound time was 3  minutes and 56.4 seconds, average power of 28.9 and a CDE of 101.50. Irrigation-aspiration was used to remove the residual cortex. The capsular bag was inflated with DisCoVisc and the intraocular lens was inserted in the capsular bag under direct visualization. Irrigation-aspiration was used to remove the residual DisCoVisc. The wound was inflated with balanced salt and Miostat was injected through the paracentesis tract. The wound appeared to leak and a single 10-0 nylon suture was placed across the wound. Knot was rotated and buried superiorly. Cefuroxime 0.1 mL containing 1 mg of the drug was injected through the paracentesis track. The wound was then checked for leaks. None were found. The bridle sutures were removed. The conjunctiva was closed with cautery and 3 drops of Vigamox were placed in the eye. A shield was placed on the eye and the patient was discharged to the recovery room in good condition.    ____________________________ Maylon PeppersSteven A. Gordie Crumby, MD sad:jm D: 07/13/2013 14:09:06 ET T: 07/13/2013 15:15:34 ET JOB#: [id number]  cc: Viviann SpareSteven A. Ronae Noell, MD, <Dictator> Erline LevineSTEVEN A Milany Geck MD ELECTRONICALLY SIGNED 07/20/2013 13:52

## 2015-03-26 NOTE — Op Note (Signed)
PATIENT NAME:  Shannon Spencer, Shannon Spencer MR#:  [id number] DATE OF BIRTH:  [birth year]  DATE OF PROCEDURE:  02/01/2014  PREOPERATIVE DIAGNOSIS: Metastatic prostate cancer with poor venous access.   POSTOPERATIVE DIAGNOSIS: Metastatic prostate cancer with poor venous access.  PROCEDURES:  1.  Ultrasound guidance for vascular access, right internal jugular vein.  2.  Fluoroscopic guidance for placement of catheter.  3.  Placement of CT compatible Port-A-Cath, right internal jugular vein.   SURGEON:  Annice NeedyJason S. Frimet Durfee, M.D.   ANESTHESIA:  Local with moderate conscious sedation.   FLUOROSCOPY TIME:  Less than 1 minute.   CONTRAST:  Zero.   ESTIMATED BLOOD LOSS: Minimal.   INDICATION FOR PROCEDURE:  A 79 year old male who needs a Port-A-Cath for chemotherapy due to his advanced pancreatic cancer. Risks and benefits were discussed. Informed consent was obtained.   DESCRIPTION OF THE PROCEDURE:  The patient was brought to the vascular and interventional radiology suite. The right neck and chest were sterilely prepped and draped, and a sterile surgical field was created. Ultrasound was used to help visualize a patent right internal jugular vein. This was then accessed under direct ultrasound guidance without difficulty with a Seldinger needle and a permanent image was recorded. A Spencer-wire was placed. After skin nick and dilatation, the peel-away sheath was then placed over the wire. I then anesthetized an area under the clavicle approximately 2 fingerbreadths. A transverse incision was created and an inferior pocket was created with electrocautery and blunt dissection. The port was then brought onto the field, placed into the pocket and secured to the chest wall with 2 Prolene sutures. The catheter was connected to the port and tunneled from the subclavicular incision to the access site. Fluoroscopic guidance was used to cut the catheter to an appropriate length. The catheter was then placed through the peel-away sheath  and the peel-away sheath was removed. The catheter tip was parked in excellent location in the cavoatrial junction. The pocket was then irrigated with antibiotic-impregnated saline and the wound was closed with a running 3-0 Vicryl and a 4-0 Monocryl. The access incision was closed with a single 4-0 Monocryl. The Huber needle was used to withdraw blood and flush the port with heparinized saline. Dermabond was then placed as a dressing. The patient tolerated the procedure well and was taken to the recovery room in stable condition.   ____________________________ Annice NeedyJason S. Leanza Shepperson, MD jsd:gb D: 02/01/2014 10:09:26 ET T: 02/01/2014 22:59:05 ET JOB#: [id number]  cc: Annice NeedyJason S. Tamani Durney, MD, <Dictator> Annice NeedyJASON S Tavarus Poteete MD ELECTRONICALLY SIGNED 02/03/2014 14:38

## 2015-08-04 DEATH — deceased
